# Patient Record
Sex: Male | Born: 1975 | Race: White | Hispanic: No | Smoking: Former smoker
Health system: Southern US, Community
[De-identification: ages and names within clinical notes are randomized; demographics above are authoritative.]

## PROBLEM LIST (undated history)

## (undated) DIAGNOSIS — J302 Other seasonal allergic rhinitis: Secondary | ICD-10-CM

## (undated) HISTORY — DX: Other seasonal allergic rhinitis: J30.2

---

## 2001-03-25 ENCOUNTER — Encounter: Payer: Self-pay | Admitting: Emergency Medicine

## 2001-03-25 ENCOUNTER — Emergency Department (HOSPITAL_COMMUNITY): Admission: EM | Admit: 2001-03-25 | Discharge: 2001-03-25 | Payer: Self-pay | Admitting: Emergency Medicine

## 2001-04-26 ENCOUNTER — Encounter: Payer: Self-pay | Admitting: Emergency Medicine

## 2001-04-26 ENCOUNTER — Emergency Department (HOSPITAL_COMMUNITY): Admission: EM | Admit: 2001-04-26 | Discharge: 2001-04-27 | Payer: Self-pay | Admitting: Emergency Medicine

## 2001-12-02 ENCOUNTER — Encounter: Admission: RE | Admit: 2001-12-02 | Discharge: 2001-12-02 | Payer: Self-pay | Admitting: Family Medicine

## 2001-12-02 ENCOUNTER — Encounter: Payer: Self-pay | Admitting: Family Medicine

## 2005-10-07 ENCOUNTER — Ambulatory Visit: Payer: Self-pay | Admitting: Family Medicine

## 2005-10-13 ENCOUNTER — Ambulatory Visit: Payer: Self-pay | Admitting: Family Medicine

## 2006-02-13 ENCOUNTER — Ambulatory Visit: Payer: Self-pay | Admitting: Internal Medicine

## 2006-02-13 LAB — CONVERTED CEMR LAB
Bilirubin Urine: NEGATIVE
Hemoglobin, Urine: NEGATIVE
Ketones, ur: NEGATIVE mg/dL
Leukocytes, UA: NEGATIVE
Nitrite: NEGATIVE
Protein, ur: NEGATIVE mg/dL
RBC / HPF: NONE SEEN (ref <3)
Specific Gravity, Urine: 1.027 (ref 1.005–1.03)
Urine Glucose: NEGATIVE mg/dL
Urobilinogen, UA: 0.2 (ref 0.0–1.0)
WBC, UA: NONE SEEN cells/hpf (ref <3)
pH: 6 (ref 5.0–8.0)

## 2006-02-14 ENCOUNTER — Encounter: Payer: Self-pay | Admitting: Internal Medicine

## 2006-02-20 ENCOUNTER — Encounter: Admission: RE | Admit: 2006-02-20 | Discharge: 2006-02-20 | Payer: Self-pay | Admitting: Internal Medicine

## 2006-02-25 ENCOUNTER — Ambulatory Visit: Payer: Self-pay | Admitting: Internal Medicine

## 2006-09-23 ENCOUNTER — Ambulatory Visit: Payer: Self-pay | Admitting: Family Medicine

## 2007-05-10 ENCOUNTER — Ambulatory Visit: Payer: Self-pay | Admitting: Family Medicine

## 2009-06-04 ENCOUNTER — Ambulatory Visit: Payer: Self-pay | Admitting: Family Medicine

## 2009-09-24 ENCOUNTER — Ambulatory Visit: Payer: Self-pay | Admitting: Internal Medicine

## 2009-09-24 DIAGNOSIS — R51 Headache: Secondary | ICD-10-CM

## 2009-09-24 DIAGNOSIS — R109 Unspecified abdominal pain: Secondary | ICD-10-CM | POA: Insufficient documentation

## 2009-09-24 DIAGNOSIS — J309 Allergic rhinitis, unspecified: Secondary | ICD-10-CM | POA: Insufficient documentation

## 2009-09-24 DIAGNOSIS — R519 Headache, unspecified: Secondary | ICD-10-CM | POA: Insufficient documentation

## 2009-09-24 LAB — CONVERTED CEMR LAB
Bilirubin Urine: NEGATIVE
Blood in Urine, dipstick: NEGATIVE
Glucose, Urine, Semiquant: NEGATIVE
Ketones, urine, test strip: NEGATIVE
Nitrite: NEGATIVE
Protein, U semiquant: NEGATIVE
Specific Gravity, Urine: 1.02
Urobilinogen, UA: 0.2
WBC Urine, dipstick: NEGATIVE
pH: 5.5

## 2009-09-25 LAB — CONVERTED CEMR LAB
Basophils Absolute: 0 10*3/uL (ref 0.0–0.1)
CO2: 31 meq/L (ref 19–32)
Calcium: 9.4 mg/dL (ref 8.4–10.5)
Chloride: 103 meq/L (ref 96–112)
Eosinophils Absolute: 0.1 10*3/uL (ref 0.0–0.7)
Glucose, Bld: 84 mg/dL (ref 70–99)
HCT: 43.6 % (ref 39.0–52.0)
Hemoglobin: 15.4 g/dL (ref 13.0–17.0)
Lymphocytes Relative: 23.2 % (ref 12.0–46.0)
Lymphs Abs: 1.4 10*3/uL (ref 0.7–4.0)
MCHC: 35.4 g/dL (ref 30.0–36.0)
MCV: 93.5 fL (ref 78.0–100.0)
Monocytes Absolute: 0.4 10*3/uL (ref 0.1–1.0)
Neutro Abs: 4.2 10*3/uL (ref 1.4–7.7)
RDW: 12.9 % (ref 11.5–14.6)
Sodium: 140 meq/L (ref 135–145)

## 2009-09-26 ENCOUNTER — Encounter: Admission: RE | Admit: 2009-09-26 | Discharge: 2009-09-26 | Payer: Self-pay | Admitting: Internal Medicine

## 2009-11-01 ENCOUNTER — Ambulatory Visit: Payer: Self-pay | Admitting: Internal Medicine

## 2009-11-05 LAB — CONVERTED CEMR LAB
AST: 51 units/L — ABNORMAL HIGH (ref 0–37)
Alkaline Phosphatase: 54 units/L (ref 39–117)
LDL Cholesterol: 74 mg/dL (ref 0–99)
Total Bilirubin: 1.1 mg/dL (ref 0.3–1.2)
Total CHOL/HDL Ratio: 3

## 2009-12-06 ENCOUNTER — Telehealth: Payer: Self-pay | Admitting: Internal Medicine

## 2010-03-05 NOTE — Progress Notes (Signed)
Summary: Pt status  ---- Converted from flag ---- ---- 12/03/2009 4:43 PM, Army Fossa CMA wrote: left message for pt to call back.   ---- 12/03/2009 11:26 AM, Elita Quick E. Paz MD wrote: please check on patient, had several GI symptoms @ the time of last OV; if no better will need further evaluation ------------------------------  I spoke with pt and he states that he is doing better, symptoms are getting better. Army Fossa CMA  December 06, 2009 3:56 PM

## 2010-03-05 NOTE — Assessment & Plan Note (Signed)
Summary: bad sinus infection//allergies//lch   Vital Signs:  Patient profile:   35 year old male Height:      75 inches Weight:      208.38 pounds BMI:     26.14 Temp:     98.0 degrees F oral Pulse rate:   80 / minute Pulse rhythm:   regular BP sitting:   122 / 86  (left arm) Cuff size:   regular  Vitals Entered By: Army Fossa CMA (Jun 04, 2009 11:19 AM) CC: Pt here c/o sinus pressure, head congestion, chest congestion onset  Saturday. Has been taking Sudafed., URI symptoms   History of Present Illness:       This is a 35 year old man who presents with URI symptoms.  The symptoms began 3 days ago.  Pt took otc generic sudafed and ziacam with some relief.  The patient complains of nasal congestion, purulent nasal discharge, sore throat, productive cough, and sick contacts, but denies earache.  Associated symptoms include fever.  The patient denies low-grade fever (<100.5 degrees), fever of 100.5-103 degrees, fever of 103.1-104 degrees, fever to >104 degrees, stiff neck, dyspnea, wheezing, rash, vomiting, diarrhea, use of an antipyretic, and response to antipyretic.  The patient also reports headache.  Risk factors for Strep sinusitis include unilateral facial pain.  The patient denies the following risk factors for Strep sinusitis: unilateral nasal discharge, poor response to decongestant, double sickening, tooth pain, Strep exposure, tender adenopathy, and absence of cough.    Allergies (verified): No Known Drug Allergies  Past History:  Past medical, surgical, family and social histories (including risk factors) reviewed for relevance to current acute and chronic problems.  Past Medical History: Reviewed history from 05/10/2007 and no changes required. Unremarkable  Family History: Reviewed history and no changes required.  Social History: Reviewed history from 05/10/2007 and no changes required. Married Former Smoker Alcohol use-no Drug use-no  Review of Systems    See HPI  Physical Exam  General:  Well-developed,well-nourished,in no acute distress; alert,appropriate and cooperative throughout examination Ears:  + fluid no external deformities.   Nose:  no external deformity, R frontal sinus tenderness, and R maxillary sinus tenderness.   Mouth:  Oral mucosa and oropharynx without lesions or exudates.  Teeth in good repair. Neck:  supple and cervical lymphadenopathy.   Lungs:  Normal respiratory effort, chest expands symmetrically. Lungs are clear to auscultation, no crackles or wheezes. Heart:  normal rate and no murmur.   Psych:  Oriented X3 and normally interactive.     Impression & Recommendations:  Problem # 1:  SINUSITIS - ACUTE-NOS (ICD-461.9)  His updated medication list for this problem includes:    Augmentin 875-125 Mg Tabs (Amoxicillin-pot clavulanate) .Marland Kitchen... 1 by mouth two times a day    Nasonex 50 Mcg/act Susp (Mometasone furoate) .Marland Kitchen... 2 sprays each nostril once daily    con't neti pot     con't mucinex Instructed on treatment. Call if symptoms persist or worsen.   Complete Medication List: 1)  Augmentin 875-125 Mg Tabs (Amoxicillin-pot clavulanate) .Marland Kitchen.. 1 by mouth two times a day 2)  Nasonex 50 Mcg/act Susp (Mometasone furoate) .... 2 sprays each nostril once daily Prescriptions: AUGMENTIN 875-125 MG TABS (AMOXICILLIN-POT CLAVULANATE) 1 by mouth two times a day  #20 x 0   Entered and Authorized by:   Loreen Freud DO   Signed by:   Loreen Freud DO on 06/04/2009   Method used:   Electronically to  [facility] [address] [id number]* (retail)       [address]       [place], [place]  [address]       Ph: [phone]       Fax: [phone]   RxID:   [id number]

## 2010-03-05 NOTE — Assessment & Plan Note (Signed)
Summary: CPX AND LABS////SPH   Vital Signs:  Patient profile:   35 year old male Height:      75 inches Weight:      208.50 pounds Pulse rate:   82 / minute Pulse rhythm:   regular BP sitting:   122 / 80  (left arm) Cuff size:   regular  Vitals Entered By: Army Fossa CMA (November 01, 2009 7:55 AM) CC: CPX, fasting Comments [facility] Declines Flu shot   History of Present Illness: CPX ROS Was seen lately with headaches, headaches resolved Complaining of ongoing left lower quadrant discomfort, usually one hour after eating. No change with BMs Also for the last couple of months had constipation and diarrhea from time to time. Some nausea with constipation, no vomiting Denies any weight loss, some weight gain Admits to some heartburn No blood in the stools Denies any difficulty urinating, dysuria or gross hematuria stress level at baseline  Preventive Screening-Counseling & Management  Alcohol-Tobacco     Alcohol drinks/day: 3     Alcohol type: beer  Caffeine-Diet-Exercise     Does Patient Exercise: yes     Times/week: 7  Current Medications (verified): 1)  Claritin-D 24 Hour 10-240 Mg Xr24h-Tab (Loratadine-Pseudoephedrine)  Allergies (verified): 1)  ! Augmentin  Past History:  Past Medical History: Reviewed history from 09/24/2009 and no changes required. Allergic rhinitis  Past Surgical History: Reviewed history from 09/24/2009 and no changes required. no major  Family History: adopted   Social History: Married 1 child  Former Smoker Alcohol use- rarely  Drug use-no diet-- healthy, lots of vegetables  exercise-- Systems analyst, runs, etc Does Patient Exercise:  yes  Physical Exam  General:  alert, well-developed, and well-nourished.   Neck:  no thyromegaly Lungs:  normal respiratory effort, no intercostal retractions, no accessory muscle use, and normal breath sounds.   Heart:  normal rate, regular rhythm, no murmur, and no  gallop.   Abdomen:  soft, no distention, no masses, no guarding, no rigidity, and no rebound tenderness.  mild tenderness to palpation in the left lower quadrant Rectal:  No external abnormalities noted. Normal sphincter tone. No rectal masses or tenderness. Brown stools, Hemoccult negative Prostate:  Prostate gland firm and smooth, no enlargement, nodularity, tenderness, mass, asymmetry or induration. Extremities:  no lower extremity edema Psych:  Cognition and judgment appear intact. Alert and cooperative with normal attention span and concentration.     Impression & Recommendations:  Problem # 1:  ROUTINE GENERAL MEDICAL EXAM@HEALTH  CARE FACL (ICD-V70.0) Td today Declines flu shot Labs Continue with healthy lifestyle  Orders: Venipuncture (04540) TLB-Lipid Panel (80061-LIPID) TLB-Hepatic/Liver Function Pnl (80076-HEPATIC) TLB-B12 + Folate Pnl (98119_14782-N56/OZH) Specimen Handling (08657)  Problem # 2:  ABDOMINAL PAIN (ICD-789.00) several GI symptoms: Heart burn, left lower quadrant discomfort, diarrhea/constipation. recent labs showed no anemia, Hemoccult negative Plan: PPIs Align Requested patient to call me if not better in 4 weeks, he will be referred to GI. Colonoscopy? CT? Patient understood instructions   Complete Medication List: 1)  Claritin-d 24 Hour 10-240 Mg Xr24h-tab (Loratadine-pseudoephedrine) 2)  Prilosec Otc 20 Mg Tbec (Omeprazole magnesium) .... One by mouth every morning before breakfast 3)  Align Caps (Probiotic product) .... One by mouth daily for 6 weeks  Other Orders: Tdap => 33yrs IM (84696) Admin 1st Vaccine (29528)  Patient Instructions: 1)  call in 4 weeks if you continue with constipation-diarrhea-heartburn. I will refer you to a stomach specialist. 2)  Please schedule a follow-up appointment in 1  year.  Prescriptions: ALIGN  CAPS (PROBIOTIC PRODUCT) one by mouth daily for 6 weeks  #30 x 1   Entered and Authorized by:   Elita Quick E. Paz MD    Signed by:   Nolon Rod. Paz MD on 11/01/2009   Method used:   Print then Give to Patient   RxID:   [id number] PRILOSEC OTC 20 MG TBEC (OMEPRAZOLE MAGNESIUM) one by mouth every morning before breakfast  #30 x 1   Entered and Authorized by:   Nolon Rod. Paz MD   Signed by:   Nolon Rod. Paz MD on 11/01/2009   Method used:   Print then Give to Patient   RxID:   [id number]    Risk Factors:  Alcohol use:  yes    Type:  beer    Drinks per day:  3 Exercise:  yes    Times per week:  7    Immunizations Administered:  Tetanus Vaccine:    Vaccine Type: Tdap    Site: right deltoid    Mfr: GlaxoSmithKline    Dose: 0.5 ml    Route: IM    Given by: Army Fossa CMA    Exp. Date: 11/23/2011    Lot #: [id number]

## 2010-03-05 NOTE — Assessment & Plan Note (Signed)
Summary: WOKE UP FRI--WALKED ACROSS FLOOR, "STROBE LIGHT SENSATION", G...   Vital Signs:  Patient profile:   35 year old male Weight:      209.13 pounds Pulse rate:   55 / minute Pulse rhythm:   regular BP sitting:   132 / 84  (left arm) Cuff size:   regular  Vitals Entered By: Army Fossa CMA (September 24, 2009 11:48 AM) CC: Concerned that he may have had seizure?  Comments Woke up Friday am to turn off alarm and he could not move his arm. It looked like there were strobe lights going off, was able to still hear the radio on his alarm clock. All day Friday he was very exhausted and his joints hurt. He has HA Friday that was a lot of pressure. Today he is feeling wel.    History of Present Illness: 3 days ago as his alarm clock went off he reached and tried to cut it off, in need of a noted that his right arm was not responding: Frozen? Shaky? At the same time he saw flashing lights in front of his eyes The sx lasted  50 seconds and was followup by a persistent, and global, moderate headache. The whole day he felt slightly dizzy, very weak, achy all over.  Also had chest pain, located at the left upper chest in a very specific area (he points to the area with his finger) no substernal chest pain The symptoms gradually resolved and today, 3 days after the event, he feels back to normal. Before these events he was not taking any new meds since or over the counter drugs and he was feeling fine.  ROS No fever No runny nose or sore throat no diplopia No vomiting or diarrhea Admits to nausea for about a month associated with the ill-defined left lower quadrant abdominal discomfor, symptoms don't change with p.o. intake  No  GERD symptoms, no blood in the stools  Current Medications (verified): 1)  Claritin-D 24 Hour 10-240 Mg Xr24h-Tab (Loratadine-Pseudoephedrine)  Allergies (verified): No Known Drug Allergies  Past History:  Past Medical History: Allergic rhinitis  Past  Surgical History: no major  Social History: Married 1 child  Former Smoker Alcohol use-no Drug use-no  Physical Exam  General:  alert, well-developed, and well-nourished.   Neck:  full range of motion, nontender to palpation of the cervical spine Lungs:  normal respiratory effort, no intercostal retractions, no accessory muscle use, and normal breath sounds.   Heart:  normal rate, regular rhythm, and no murmur.   Abdomen:  soft, no distention, no masses, no guarding, no rigidity, and no rebound tenderness.  mild tenderness to palpation in the left lower quadrant Extremities:  no edema Neurologic:  alert & oriented X3, cranial nerves II-XII intact, strength normal in all extremities, gait normal, and DTRs symmetrical and normal.   Psych:  Oriented X3, memory intact for recent and remote, normally interactive, good eye contact, not anxious appearing, and not depressed appearing.     Impression & Recommendations:  Problem # 1:  HEADACHE (ICD-784.0) Assessment New presents with symptoms as described above. He had a headache that lasted several hours, prior to that his arm was shaky and he saw some "bright light before his eyes" DDX includes migraine headaches, petit mal?, others plan: Labs MRI of the brain with and without If workup negative, will observe for now unless symptoms recur  Orders: Venipuncture (16109) TLB-BMP (Basic Metabolic Panel-BMET) (80048-METABOL) TLB-CBC Platelet - w/Differential (85025-CBCD) TLB-Sedimentation Rate (ESR) (  04540-JWJ) Radiology Referral (Radiology)  Problem # 2:  abdominal pain reassess and return to the office  Complete Medication List: 1)  Claritin-d 24 Hour 10-240 Mg Xr24h-tab (Loratadine-pseudoephedrine)  Other Orders: UA Dipstick w/o Micro (automated)  (81003)  Patient Instructions: 1)  Please schedule a follow-up appointment in 3 to 4 weeks, fasting, physical exam  Laboratory Results   Urine Tests    Routine Urinalysis     Color: yellow Appearance: Clear Glucose: negative   (Normal Range: Negative) Bilirubin: negative   (Normal Range: Negative) Ketone: negative   (Normal Range: Negative) Spec. Gravity: 1.020   (Normal Range: 1.003-1.035) Blood: negative   (Normal Range: Negative) pH: 5.5   (Normal Range: 5.0-8.0) Protein: negative   (Normal Range: Negative) Urobilinogen: 0.2   (Normal Range: 0-1) Nitrite: negative   (Normal Range: Negative) Leukocyte Esterace: negative   (Normal Range: Negative)    Comments: Army Fossa CMA  September 24, 2009 2:04 PM

## 2010-06-21 NOTE — Letter (Signed)
February 25, 2006     RE:  HIRAN, LEARD  MRN:  [id number]  /  DOB:  Feb 16, 1975   [facility]  [address]  [place], [place] [address]    Dear Colleagues,   This letter is to introduce you to one of my patients, Mr. Jimmy Hardin.  He is a healthy 35 year old gentleman whom I saw early this  month with left groin pain, along with some testicular discomfort.   On my examination he was slightly tender on the left lower quadrant.  The prostate exam was normal.  testicles were normal.  The left  spermatic cord was slightly tender and may be swollen.   My initial impression was that he probably has some mild orchitis.  He  was prescribed Cipro.   An ultrasound done showed normal genitalia.   The urine culture came back and showed Enterococcus, so he was  subsequently changed from Cipro to Macrobid.  I saw him today in  followup.  He still has some discomfort in the testicles, and in fact  now also has some frequency and bladder irritation.   Please evaluate this patient in reference to his genital discomfort with  tenderness over the spermatic cord.  He also has a urinary tract  infection and I wonder if he needs further evaluation.  If  you have any  questions, please do not hesitate to call me.    Sincerely,      Willow Ora, MD  Electronically Signed    JP/MedQ  DD: 02/25/2006  DT: 02/25/2006  Job #: [id number]

## 2012-02-04 HISTORY — PX: ESOPHAGOGASTRODUODENOSCOPY ENDOSCOPY: SHX5814

## 2013-02-09 ENCOUNTER — Telehealth: Payer: Self-pay | Admitting: Internal Medicine

## 2013-02-09 NOTE — Telephone Encounter (Signed)
Patient scheduled appointment for 1.16.15

## 2013-02-09 NOTE — Telephone Encounter (Signed)
Patient would [facility] Is this ok? Thanks!

## 2013-02-09 NOTE — Telephone Encounter (Signed)
Yes please

## 2013-02-09 NOTE — Telephone Encounter (Signed)
OK with me.

## 2013-02-18 ENCOUNTER — Ambulatory Visit: Payer: Self-pay | Admitting: Family

## 2013-02-23 ENCOUNTER — Ambulatory Visit (INDEPENDENT_AMBULATORY_CARE_PROVIDER_SITE_OTHER): Payer: BC Managed Care – PPO | Admitting: Family

## 2013-02-23 ENCOUNTER — Encounter: Payer: Self-pay | Admitting: Family

## 2013-02-23 VITALS — BP 104/70 | HR 52 | Temp 97.9°F | Resp 16 | Ht 75.0 in | Wt 188.0 lb

## 2013-02-23 DIAGNOSIS — M549 Dorsalgia, unspecified: Secondary | ICD-10-CM | POA: Insufficient documentation

## 2013-02-23 DIAGNOSIS — R109 Unspecified abdominal pain: Secondary | ICD-10-CM

## 2013-02-23 DIAGNOSIS — F329 Major depressive disorder, single episode, unspecified: Secondary | ICD-10-CM | POA: Insufficient documentation

## 2013-02-23 DIAGNOSIS — F411 Generalized anxiety disorder: Secondary | ICD-10-CM

## 2013-02-23 DIAGNOSIS — F419 Anxiety disorder, unspecified: Secondary | ICD-10-CM

## 2013-02-23 DIAGNOSIS — R51 Headache: Secondary | ICD-10-CM

## 2013-02-23 LAB — POCT URINALYSIS DIPSTICK
BILIRUBIN UA: NEGATIVE
Glucose, UA: NEGATIVE
Ketones, UA: NEGATIVE
Leukocytes, UA: NEGATIVE
NITRITE UA: NEGATIVE
PH UA: 8
PROTEIN UA: NEGATIVE
RBC UA: NEGATIVE
Spec Grav, UA: <=1.005
Urobilinogen, UA: 0.2

## 2013-02-23 NOTE — Assessment & Plan Note (Signed)
We discussed possibility of adding SSRI.  He will think about it and let me know next visit. In regards to the insomnia, recommended trial of melatonin or 1 tab of benadryl at bedtime. 30 minutes spent with pt today.  >50% of this time was spent counseling patient on anxiety and treatment.

## 2013-02-23 NOTE — Progress Notes (Signed)
Pre visit review using our clinic review tool, if applicable. No additional management support is needed unless otherwise documented below in the visit note. 

## 2013-02-23 NOTE — Addendum Note (Signed)
Addended by: Mervin KungFERGERSON, Andren Bethea A on: 02/23/2013 08:16 AM   Modules accepted: Orders

## 2013-02-23 NOTE — Progress Notes (Signed)
   Subjective:    Patient ID: Jimmy Hardin, male    DOB: [birth year], 38 y.o.   MRN: [id number]  HPI  Jimmy Hardin is a 38 yr old male who presents today to establish care.  He was established with [facility] >3 years ago.  Reports that he develops intermittent left lower back pain.  Notes some darker urine, but no visible blood.   Notes some issues sleeping x 1 year.  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 Finds the financial responsibility somewhat overwhelming.  Reports a few times he has experienced panic attacks.  Reports increased nervousness.  Can't let his mind stop at night.  Having trouble sleeping.     Review of Systems Past Medical History  Diagnosis Date  . Seasonal allergies     History   Social History  . Marital Status: Married    Spouse Name: N/A    Number of Children: N/A  . Years of Education: N/A   Occupational History  . Not on file.   Social History Main Topics  . Smoking status: Former Games developermoker  . Smokeless tobacco: Never Used  . Alcohol Use: Yes     Comment: occasional glass of wine  . Drug Use: Not on file  . Sexual Activity: Not on file   Other Topics Concern  . 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 Esophagogastroduodenoscopy endoscopy  2014    told inflammatory bowel and not to eat gluten    Family History  Problem Relation Age of Onset  . Adopted: Yes    Allergies  Allergen Reactions  . Amoxicillin-Pot Clavulanate     No current outpatient prescriptions on file prior to visit.   No current facility-administered medications on file prior to visit.    BP 104/70  Pulse 52  Temp(Src) 97.9 F (36.6 C) (Oral)  Resp 16  Ht 6\' 3"  (1.905 m)  Wt 188 lb (85.276 kg)  BMI 23.50 kg/m2  SpO2 99%         Objective:   Physical Exam  Constitutional: He is oriented to person, place, and time. He appears well-developed and well-nourished. No distress.  HENT:  Head: Normocephalic and atraumatic.  Cardiovascular: Normal rate and regular rhythm.   No murmur heard. Pulmonary/Chest: Effort normal and breath sounds normal. No respiratory distress. He has no wheezes. He has no rales. He exhibits no tenderness.  Abdominal: Soft. He exhibits no distension and no mass. There is no rebound and no guarding.  Mild suprapubic tenderness  Genitourinary:  Mild left CVA tenderness  Musculoskeletal: He exhibits no edema.  Neurological: He is alert and oriented to person, place, and time.  Skin: Skin is warm and dry.  Psychiatric: He has a normal mood and affect. His behavior is normal. Judgment and thought content normal.          Assessment & Plan:

## 2013-02-23 NOTE — Assessment & Plan Note (Signed)
Urinalysis is negative.  ? Musculoskeletal low back pain.  No current urinary symptoms. Recommended trial of aleve, call if symptoms worsen or if symptoms do not improve.

## 2013-02-23 NOTE — Patient Instructions (Addendum)
You can try benadryl 25mg  at bedtime to help with sleep or a tablet of melatonin. Please schedule a fasting physical at your convenience. You can try aleve 220mg  twice daily for the next few days for your back pain.  [facility]!

## 2013-03-14 ENCOUNTER — Ambulatory Visit (INDEPENDENT_AMBULATORY_CARE_PROVIDER_SITE_OTHER): Payer: BC Managed Care – PPO | Admitting: Family

## 2013-03-14 ENCOUNTER — Encounter: Payer: Self-pay | Admitting: Family

## 2013-03-14 VITALS — BP 106/78 | HR 54 | Temp 97.8°F | Resp 16 | Ht 75.0 in | Wt 193.1 lb

## 2013-03-14 DIAGNOSIS — Z0000 Encounter for general adult medical examination without abnormal findings: Secondary | ICD-10-CM

## 2013-03-14 LAB — HEPATIC FUNCTION PANEL
ALBUMIN: 4.4 g/dL (ref 3.5–5.2)
ALK PHOS: 65 U/L (ref 39–117)
ALT: 17 U/L (ref 0–53)
AST: 34 U/L (ref 0–37)
Bilirubin, Direct: 0.4 mg/dL — ABNORMAL HIGH (ref 0.0–0.3)
Indirect Bilirubin: 1.3 mg/dL — ABNORMAL HIGH (ref 0.2–1.2)
TOTAL PROTEIN: 6.2 g/dL (ref 6.0–8.3)
Total Bilirubin: 1.7 mg/dL — ABNORMAL HIGH (ref 0.2–1.2)

## 2013-03-14 LAB — LIPID PANEL
Cholesterol: 124 mg/dL (ref 0–200)
HDL: 60 mg/dL (ref >39)
LDL Cholesterol: 56 mg/dL (ref 0–99)
Total CHOL/HDL Ratio: 2.1 Ratio
Triglycerides: 38 mg/dL (ref <150)
VLDL: 8 mg/dL (ref 0–40)

## 2013-03-14 LAB — CBC WITH DIFFERENTIAL/PLATELET
Basophils Absolute: 0 10*3/uL (ref 0.0–0.1)
Basophils Relative: 0 % (ref 0–1)
Eosinophils Absolute: 0.2 10*3/uL (ref 0.0–0.7)
Eosinophils Relative: 3 % (ref 0–5)
HEMATOCRIT: 40.9 % (ref 39.0–52.0)
HEMOGLOBIN: 14.4 g/dL (ref 13.0–17.0)
LYMPHS PCT: 24 % (ref 12–46)
Lymphs Abs: 1.4 10*3/uL (ref 0.7–4.0)
MCH: 30.7 pg (ref 26.0–34.0)
MCHC: 35.2 g/dL (ref 30.0–36.0)
MCV: 87.2 fL (ref 78.0–100.0)
MONO ABS: 0.4 10*3/uL (ref 0.1–1.0)
MONOS PCT: 7 % (ref 3–12)
NEUTROS ABS: 3.7 10*3/uL (ref 1.7–7.7)
Neutrophils Relative %: 66 % (ref 43–77)
Platelets: 240 10*3/uL (ref 150–400)
RBC: 4.69 MIL/uL (ref 4.22–5.81)
RDW: 13.5 % (ref 11.5–15.5)
WBC: 5.6 10*3/uL (ref 4.0–10.5)

## 2013-03-14 LAB — BASIC METABOLIC PANEL WITH GFR
BUN: 13 mg/dL (ref 6–23)
CHLORIDE: 102 meq/L (ref 96–112)
CO2: 31 mEq/L (ref 19–32)
CREATININE: 0.76 mg/dL (ref 0.50–1.35)
Calcium: 9.2 mg/dL (ref 8.4–10.5)
GFR, Est African American: >89 mL/min
Glucose, Bld: 76 mg/dL (ref 70–99)
Potassium: 4.2 mEq/L (ref 3.5–5.3)
Sodium: 141 mEq/L (ref 135–145)

## 2013-03-14 LAB — TSH: TSH: 1.994 u[IU]/mL (ref 0.350–4.500)

## 2013-03-14 NOTE — Progress Notes (Signed)
Pre visit review using our clinic review tool, if applicable. No additional management support is needed unless otherwise documented below in the visit note. 

## 2013-03-14 NOTE — Assessment & Plan Note (Signed)
Continue healthy diet, exercise.  Obtain fasting labs including TSH due to fluctuation in weight.   We discussed adding metamucil for constipation and trial of melatonin for sleep. He inquired re: xanax prn sleep and I advised him to try melatonin first.  Hopefully his stress is going to improve over the coming months and he will be able to sleep better.

## 2013-03-14 NOTE — Patient Instructions (Signed)
Please complete lab work prior to leaving. Follow up in 1 year, sooner if problems/concerns.  

## 2013-03-14 NOTE — Progress Notes (Signed)
Subjective:    Patient ID: Jimmy Hardin, male    DOB: [birth year], 38 y.o.   MRN: [id number]  HPI  [name] is a 38 yr old male who presents today for cpx.  Patient presents today for complete physical.  Immunizations: tetanus is up to date.  Flu shot- declines.   Diet:healthy diet.  Exercise: enjoys running/snowboarding. Wt Readings from Last 3 Encounters:  03/14/13 193 lb 1.9 oz (87.599 kg)  02/23/13 188 lb (85.276 kg)  11/01/09 208 lb 8 oz (94.575 kg)   Reports weight fluctuates.  Weight  175 lbs a few months ago.  Reports periods of constipation.  Sometimes skips a few days.  Last visit we discussed anxiety and insomnia  Recommended melatonin or benadryl prn HS.    Low back pain- felt to be musculoskeletal in nature.  Was recommended that he have trial of aleve.  Reports that his back is still mildly sore, but improved. Reports resolution of urinary tract symptoms.   Insomnia- notes some improvement in his insomnia with benadryl, but it is not working as well as it had. Does not wish to try [place].    Review of Systems  See HPI  Past Medical History  Diagnosis Date  . Seasonal allergies     History   Social History  . Marital Status: Married    Spouse Name: N/A    Number of Children: N/A  . Years of Education: N/A   Occupational History  . Not on file.   Social History Main Topics  . Smoking status: Former Games developer  . Smokeless tobacco: Never Used  . Alcohol Use: Yes     Comment: occasional glass of wine  . Drug Use: Not on file  . Sexual Activity: Not on file   Other Topics Concern  . 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 Esophagogastroduodenoscopy endoscopy  2014    told inflammatory bowel and not to eat gluten    Family History  Problem Relation Age of  Onset  . Adopted: Yes    Allergies  Allergen Reactions  . Amoxicillin-Pot Clavulanate     No current outpatient prescriptions on file prior to visit.   No current facility-administered medications on file prior to visit.    BP 106/78  Pulse 54  Temp(Src) 97.8 F (36.6 C) (Oral)  Resp 16  Ht 6\' 3"  (1.905 m)  Wt 193 lb 1.9 oz (87.599 kg)  BMI 24.14 kg/m2  SpO2 99%       Objective:   Physical Exam Physical Exam  Constitutional: He is oriented to person, place, and time. He appears well-developed and well-nourished. No distress.  HENT:  Head: Normocephalic and atraumatic.  Right Ear: Tympanic membrane and ear canal normal.  Left Ear: Tympanic membrane and ear canal normal.  Mouth/Throat: Oropharynx is clear and moist.  Eyes: Pupils are equal, round, and reactive to light. No scleral icterus.  Neck: Normal range of motion. No thyromegaly present.  Cardiovascular: Normal rate and regular rhythm.   No murmur heard. Pulmonary/Chest: Effort normal and breath sounds normal. No respiratory distress. He has no wheezes. He has no rales. He exhibits no tenderness.  Abdominal: Soft. Bowel sounds are normal. He exhibits no distension and no  mass. There is no tenderness. There is no rebound and no guarding.  Musculoskeletal: He exhibits no edema.  Lymphadenopathy:    He has no cervical adenopathy.  Neurological: He is alert and oriented to person, place, and time. He has normal patellar reflexes. He exhibits normal muscle tone. Coordination normal.  Skin: Skin is warm and dry. multiple tattoos noted. Small non-inflammed pimple/cyst noted right posterior neck.   Psychiatric: He has a normal mood and affect. His behavior is normal. Judgment and thought content normal.          Assessment & Plan:          Assessment & Plan:

## 2013-03-16 ENCOUNTER — Telehealth: Payer: Self-pay | Admitting: *Deleted

## 2013-03-16 DIAGNOSIS — R17 Unspecified jaundice: Secondary | ICD-10-CM

## 2013-03-16 NOTE — Telephone Encounter (Signed)
Notified pt and lab order entered.

## 2013-03-16 NOTE — Telephone Encounter (Signed)
Message copied by Kathi SimpersFERGERSON, Herbert Marken A on Wed Mar 16, 2013  4:21 PM ------      Message from: O'SULLIVAN, MELISSA      Created: Tue Mar 15, 2013  1:23 PM       Bilirubin is elevated.  Repeat in 2 weeks (diagnosis elevated bilirubin).  Kidneys, sugar, cholesterol, thyroid, blood count all look good. ------

## 2013-03-18 ENCOUNTER — Ambulatory Visit (INDEPENDENT_AMBULATORY_CARE_PROVIDER_SITE_OTHER): Payer: BC Managed Care – PPO | Admitting: Family Medicine

## 2013-03-18 ENCOUNTER — Encounter: Payer: Self-pay | Admitting: Family Medicine

## 2013-03-18 VITALS — BP 106/78 | HR 56 | Temp 97.8°F | Resp 16 | Ht 75.0 in | Wt 195.0 lb

## 2013-03-18 DIAGNOSIS — R202 Paresthesia of skin: Secondary | ICD-10-CM

## 2013-03-18 DIAGNOSIS — R209 Unspecified disturbances of skin sensation: Secondary | ICD-10-CM

## 2013-03-18 DIAGNOSIS — R55 Syncope and collapse: Secondary | ICD-10-CM

## 2013-03-18 DIAGNOSIS — F418 Other specified anxiety disorders: Secondary | ICD-10-CM

## 2013-03-18 DIAGNOSIS — M542 Cervicalgia: Secondary | ICD-10-CM

## 2013-03-18 DIAGNOSIS — F411 Generalized anxiety disorder: Secondary | ICD-10-CM

## 2013-03-18 DIAGNOSIS — R42 Dizziness and giddiness: Secondary | ICD-10-CM

## 2013-03-18 DIAGNOSIS — F341 Dysthymic disorder: Secondary | ICD-10-CM

## 2013-03-18 DIAGNOSIS — H6690 Otitis media, unspecified, unspecified ear: Secondary | ICD-10-CM

## 2013-03-18 DIAGNOSIS — R110 Nausea: Secondary | ICD-10-CM

## 2013-03-18 DIAGNOSIS — R51 Headache: Secondary | ICD-10-CM

## 2013-03-18 MED ORDER — KETOROLAC TROMETHAMINE 30 MG/ML IJ SOLN: 30.0000 mg | Freq: Once | INTRAMUSCULAR | Status: DC

## 2013-03-18 MED ORDER — ALPRAZOLAM 0.25 MG PO TABS: 0.2500 mg | ORAL_TABLET | Freq: Two times a day (BID) | ORAL | Status: DC | PRN

## 2013-03-18 MED ORDER — ESCITALOPRAM OXALATE 10 MG PO TABS: 10.0000 mg | ORAL_TABLET | Freq: Every day | ORAL | Status: DC

## 2013-03-18 MED ORDER — KETOROLAC TROMETHAMINE 30 MG/ML IJ SOLN
30.0000 mg | Freq: Once | INTRAMUSCULAR | Status: AC
  Administered 2013-03-18: 30 mg via INTRAMUSCULAR

## 2013-03-18 MED ORDER — PROMETHAZINE HCL 25 MG PO TABS: 25.0000 mg | ORAL_TABLET | Freq: Four times a day (QID) | ORAL | Status: DC | PRN

## 2013-03-18 NOTE — Progress Notes (Signed)
Pre visit review using our clinic review tool, if applicable. No additional management support is needed unless otherwise documented below in the visit note/SLS  

## 2013-03-18 NOTE — Patient Instructions (Addendum)
Gatorade, small, frequent meals every 4 hours with proteins   Generalized Anxiety Disorder Generalized anxiety disorder (GAD) is a mental disorder. It interferes with life functions, including relationships, work, and school. GAD is different from normal anxiety, which everyone experiences at some point in their lives in response to specific life events and activities. Normal anxiety actually helps us prepare for and get through these life events and activities. Normal anxiety goes away after the event or activity is over.  GAD causes anxiety that is not necessarily related to specific events or activities. It also causes excess anxiety in proportion to specific events or activities. The anxiety associated with GAD is also difficult to control. GAD can vary from mild to severe. People with severe GAD can have intense waves of anxiety with physical symptoms (panic attacks).  SYMPTOMS The anxiety and worry associated with GAD are difficult to control. This anxiety and worry are related to many life events and activities and also occur more days than not for 6 months or longer. People with GAD also have three or more of the following symptoms (one or more in children):  Restlessness.   Fatigue.  Difficulty concentrating.   Irritability.  Muscle tension.  Difficulty sleeping or unsatisfying sleep. DIAGNOSIS GAD is diagnosed through an assessment by your caregiver. Your caregiver will ask you questions aboutyour mood,physical symptoms, and events in your life. Your caregiver may ask you about your medical history and use of alcohol or drugs, including prescription medications. Your caregiver may also do a physical exam and blood tests. Certain medical conditions and the use of certain substances can cause symptoms similar to those associated with GAD. Your caregiver may refer you to a mental health specialist for further evaluation. TREATMENT The following therapies are usually used to treat  GAD:   Medication Antidepressant medication usually is prescribed for long-term daily control. Antianxiety medications may be added in severe cases, especially when panic attacks occur.   Talk therapy (psychotherapy) Certain types of talk therapy can be helpful in treating GAD by providing support, education, and guidance. A form of talk therapy called cognitive behavioral therapy can teach you healthy ways to think about and react to daily life events and activities.  Stress managementtechniques These include yoga, meditation, and exercise and can be very helpful when they are practiced regularly. A mental health specialist can help determine which treatment is best for you. Some people see improvement with one therapy. However, other people require a combination of therapies. Document Released: 05/17/2012 Document Reviewed: 05/17/2012 [facility] Patient Information 2014 [place], [place].

## 2013-03-20 ENCOUNTER — Encounter: Payer: Self-pay | Admitting: Family Medicine

## 2013-03-20 DIAGNOSIS — H6690 Otitis media, unspecified, unspecified ear: Secondary | ICD-10-CM | POA: Insufficient documentation

## 2013-03-20 MED ORDER — CIPROFLOXACIN HCL 500 MG PO TABS: 500.0000 mg | ORAL_TABLET | Freq: Two times a day (BID) | ORAL | Status: DC

## 2013-03-20 NOTE — Assessment & Plan Note (Signed)
Ciprofloxacin is prescribed. Increase rest and fluids.

## 2013-03-20 NOTE — Progress Notes (Signed)
Duplicate note

## 2013-03-20 NOTE — Assessment & Plan Note (Addendum)
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 They were having a somewhat heated discussion with his ex wife when his symptoms began. He was discussing taxes with her when his symptoms began. He began to feel dizzy and might headache. He says he felt as if he was going to pass out but never did lose consciousness. He felt tingly on all of his peripheries and around his tongue and lips. He had trouble thinking straight and developed headache. He says he noted sensitivity to cold and a strange odor as well. At this point his symptoms have essentially resolved. He did feel sweaty and hot but the symptoms have also resolved. He agrees to start Lexapro 10 mg and given Alprazolam in a small amount to use prn and will reassess at next visit. H e will seek care sooner as needed. Spent greater than 30 minutes with patient 25 minutes was in discussion/counseling

## 2013-03-20 NOTE — Progress Notes (Addendum)
Patient ID: Jimmy Hardin, male   DOB: November 10, 1975, 38 y.o.   MRN: [id number] Jimmy Hardin [id number] November 10, 1975 03/20/2013      Progress Note-Follow Up  Subjective  Chief Complaint  Chief Complaint  Patient presents with  . Dizziness    Pt c/o episode today of dizziness, while conscious, for approx 30 seconds that took 3-4 mintues to recover; impaired cognitive function, sensitivity to cold & odor; neck pain; H/A posterior; tingling in limbs    HPI  Patient is a 38 year old Caucasian male who is in today for evaluation of acute symptoms. This morning and earlier in the week he felt fine. This afternoon while he was speaking [place] he became flushed and lightheaded. He felt dizzy as if he would fall. He felt nauseous and tremulous. He had difficulty concentrating and he felt paresthesias in his arms, legs but and tongue. He developed a headache and neck pain and said he had a funny taste and smell sensation. Symptoms are greatly improved. He did not have any loss of consciousness or hit his head. He did not have any chest pain. 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  Past Medical History  Diagnosis Date  . Seasonal allergies     Past Surgical History  Procedure Laterality Date  . Esophagogastroduodenoscopy endoscopy  2014    told inflammatory bowel and not to eat gluten    Family History  Problem Relation Age of Onset  . Adopted: Yes    History   Social History  . Marital Status: Married    Spouse Name: N/A    Number of Children: N/A  . Years of Education: N/A   Occupational History  . Not on file.   Social History Main Topics  . Smoking status: Former Games developermoker  . Smokeless tobacco: Never Used  . Alcohol Use: Yes     Comment: occasional glass of wine  . Drug Use: Not on file  . Sexual Activity: Not on file   Other Topics Concern  . 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 diphenhydrAMINE (SOMINEX) 25 MG tablet Take 25 mg by mouth at bedtime as needed for allergies or sleep. Benadryl       No current facility-administered medications on file prior to visit.    Allergies  Allergen Reactions  . Amoxicillin-Pot Clavulanate     Review of Systems  Review of Systems  Constitutional: Positive for malaise/fatigue. Negative for fever.  HENT: Negative for congestion.   Eyes: Negative for discharge.  Respiratory: Negative for shortness of breath.   Cardiovascular: Negative for chest pain, palpitations and leg swelling.  Gastrointestinal: Positive for nausea. Negative for abdominal pain and diarrhea.  Genitourinary: Negative for dysuria.  Musculoskeletal: Positive for neck pain. Negative for falls.  Skin: Negative for rash.  Neurological: Positive for tingling and headaches. Negative for loss of consciousness.  Endo/Heme/Allergies: Negative for polydipsia.  Psychiatric/Behavioral: Positive for depression. Negative for suicidal ideas. The patient is nervous/anxious. The patient does not have insomnia.     Objective  BP 106/78  Pulse 56  Temp(Src) 97.8 F (36.6 C) (Oral)  Resp 16  Ht 6\' 3"  (1.905 m)  Wt 195 lb (88.451 kg)  BMI 24.37 kg/m2  SpO2 97%  Physical Exam  Physical Exam  Constitutional: He is oriented to person, place, and time and well-developed, well-nourished, and in no distress. No distress.  HENT:  Head: Normocephalic and atraumatic.  TMs dull and erythematous  Eyes: Conjunctivae are normal.  Neck: Neck supple. No thyromegaly present.  Cardiovascular: Normal rate, regular rhythm and normal heart sounds.   No murmur heard. Pulmonary/Chest: Effort normal and breath sounds normal. No respiratory distress.  Abdominal:  He exhibits no distension and no mass. There is no tenderness.  Musculoskeletal: He exhibits no edema.  Neurological: He is alert and oriented to person, place, and time.  Skin: Skin is warm.  Psychiatric: Memory and judgment normal.  Anxious, jittery    Lab Results  Component Value Date   TSH 1.994 03/14/2013   Lab Results  Component Value Date   WBC 5.6 03/14/2013   HGB 14.4 03/14/2013   HCT 40.9 03/14/2013   MCV 87.2 03/14/2013   PLT 240 03/14/2013   Lab Results  Component Value Date   CREATININE 0.76 03/14/2013   BUN 13 03/14/2013   NA 141 03/14/2013   K 4.2 03/14/2013   CL 102 03/14/2013   CO2 31 03/14/2013   Lab Results  Component Value Date   ALT 17 03/14/2013   AST 34 03/14/2013   ALKPHOS 65 03/14/2013   BILITOT 1.7* 03/14/2013   Lab Results  Component Value Date   CHOL 124 03/14/2013   Lab Results  Component Value Date   HDL 60 03/14/2013   Lab Results  Component Value Date   LDLCALC 56 03/14/2013   Lab Results  Component Value Date   TRIG 38 03/14/2013   Lab Results  Component Value Date   CHOLHDL 2.1 03/14/2013     Assessment & Plan  Anxiety state, unspecified Patient under a great deal of 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 They were having a somewhat heated discussion with his ex wife when his symptoms began. He was discussing taxes with her when his symptoms began. He began to feel dizzy and might headache. He says he felt as if he was going to pass out but never did lose consciousness. He felt tingly on all of his peripheries and around his tongue and lips. He had trouble thinking straight and developed headache. He says he noted sensitivity to cold and a strange odor as well. At this point his symptoms have essentially resolved. He did feel sweaty and hot but the symptoms have also resolved. He agrees to start Lexapro 10 mg and given Alprazolam in a small amount to use prn and will reassess at  next visit. H e will seek care sooner as needed. Spent greater than 30 minutes with patient 25 minutes was in discussion/counseling  HEADACHE Given a shot of Toradol today and allowed some Phenergan to use prn in the future for migrainous symptoms.  Otitis media Ciprofloxacin is prescribed. Increase rest and fluids.

## 2013-03-20 NOTE — Addendum Note (Signed)
Addended by: Danise EdgeBLYTH, Holley Kocurek A on: 03/20/2013 03:08 PM   Modules accepted: Level of Service

## 2013-03-20 NOTE — Assessment & Plan Note (Signed)
Given a shot of Toradol today and allowed some Phenergan to use prn in the future for migrainous symptoms.

## 2013-03-25 ENCOUNTER — Ambulatory Visit (INDEPENDENT_AMBULATORY_CARE_PROVIDER_SITE_OTHER): Payer: BC Managed Care – PPO | Admitting: Family Medicine

## 2013-03-25 ENCOUNTER — Encounter: Payer: Self-pay | Admitting: Family Medicine

## 2013-03-25 VITALS — BP 124/84 | HR 53 | Temp 97.9°F | Resp 18 | Ht 75.0 in | Wt 190.5 lb

## 2013-03-25 DIAGNOSIS — F341 Dysthymic disorder: Secondary | ICD-10-CM

## 2013-03-25 DIAGNOSIS — F418 Other specified anxiety disorders: Secondary | ICD-10-CM

## 2013-03-25 DIAGNOSIS — H6690 Otitis media, unspecified, unspecified ear: Secondary | ICD-10-CM

## 2013-03-25 DIAGNOSIS — F411 Generalized anxiety disorder: Secondary | ICD-10-CM

## 2013-03-25 LAB — HEPATIC FUNCTION PANEL
ALT: 16 U/L (ref 0–53)
AST: 24 U/L (ref 0–37)
Albumin: 5 g/dL (ref 3.5–5.2)
Alkaline Phosphatase: 73 U/L (ref 39–117)
BILIRUBIN DIRECT: 0.3 mg/dL (ref 0.0–0.3)
BILIRUBIN INDIRECT: 1.3 mg/dL — AB (ref 0.2–1.2)
Total Bilirubin: 1.6 mg/dL — ABNORMAL HIGH (ref 0.2–1.2)
Total Protein: 7.2 g/dL (ref 6.0–8.3)

## 2013-03-25 MED ORDER — ALPRAZOLAM 0.25 MG PO TABS: 0.2500 mg | ORAL_TABLET | Freq: Two times a day (BID) | ORAL | Status: DC | PRN

## 2013-03-25 MED ORDER — FLUOXETINE HCL 20 MG PO TABS: 20.0000 mg | ORAL_TABLET | Freq: Every day | ORAL | Status: DC

## 2013-03-25 MED ORDER — ESCITALOPRAM OXALATE 10 MG PO TABS: 5.0000 mg | ORAL_TABLET | Freq: Every day | ORAL | Status: DC

## 2013-03-25 NOTE — Progress Notes (Signed)
Pre visit review using our clinic review tool, if applicable. No additional management support is needed unless otherwise documented below in the visit note/SLS  

## 2013-03-25 NOTE — Patient Instructions (Signed)
Bipolar Disorder °Bipolar disorder is a mental illness. The term bipolar disorder actually is used to describe a group of disorders that all share varying degrees of emotional highs and lows that can interfere with daily functioning, such as work, school, or relationships. Bipolar disorder also can lead to drug abuse, hospitalization, and suicide. °The emotional highs of bipolar disorder are periods of elation or irritability and high energy. These highs can range from a mild form (hypomania) to a severe form (mania). People experiencing episodes of hypomania may appear energetic, excitable, and highly productive. People experiencing mania may behave impulsively or erratically. They often make poor decisions. They may have difficulty sleeping. The most severe episodes of mania can involve having very distorted beliefs or perceptions about the world and seeing or hearing things that are not real (psychotic delusions and hallucinations).  °The emotional lows of bipolar disorder (depression) also can range from mild to severe. Severe episodes of bipolar depression can involve psychotic delusions and hallucinations. °Sometimes people with bipolar disorder experience a state of mixed mood. Symptoms of hypomania or mania and depression are both present during this mixed-mood episode. °SIGNS AND SYMPTOMS °There are signs and symptoms of the episodes of hypomania and mania as well as the episodes of depression. The signs and symptoms of hypomania and mania are similar but vary in severity. They include: °· Inflated self-esteem or feeling of increased self-confidence. °· Decreased need for sleep. °· Unusual talkativeness (rapid or pressured speech) or the feeling of a need to keep talking. °· Sensation of racing thoughts or constant talking, with quick shifts between topics that may or may not be related (flight of ideas). °· Decreased ability to focus or concentrate. °· Increased purposeful activity, such as work, studies,  or social activity, or nonproductive activity, such as pacing, squirming and fidgeting, or finger and toe tapping. °· Impulsive behavior and use of poor judgment, resulting in high-risk activities, such as having unprotected sex or spending excessive amounts of money. °Signs and symptoms of depression include the following:  °· Feelings of sadness, hopelessness, or helplessness. °· Frequent or uncontrollable episodes of crying. °· Lack of feeling anything or caring about anything. °· Difficulty sleeping or sleeping too much.  °· Inability to enjoy the things you used to enjoy.   °· Desire to be alone all the time.   °· Feelings of guilt or worthlessness.  °· Lack of energy or motivation.   °· Difficulty concentrating, remembering, or making decisions.  °· Change in appetite or weight beyond normal fluctuations. °· Thoughts of death or the desire to harm yourself. °DIAGNOSIS  °Bipolar disorder is diagnosed through an assessment by your caregiver. Your caregiver will ask questions about your emotional episodes. There are two main types of bipolar disorder. People with type I bipolar disorder have manic episodes with or without depressive episodes. People with type II bipolar disorder have hypomanic episodes and major depressive episodes, which are more serious than mild depression. The type of bipolar disorder you have can make an important difference in how your illness is monitored and treated. °Your caregiver may ask questions about your medical history and use of alcohol or drugs, including prescription medication. Certain medical conditions and substances also can cause emotional highs and lows that resemble bipolar disorder (secondary bipolar disorder).  °TREATMENT  °Bipolar disorder is a long-term illness. It is best controlled with continuous treatment rather than treatment only when symptoms occur. The following treatments can be prescribed for bipolar disorders: °· Medication Medication can be prescribed by  a doctor   that is an expert in treating mental disorders (psychiatrists). Medications called mood stabilizers are usually prescribed to help control the illness. Other medications are sometimes added if symptoms of mania, depression, or psychotic delusions and hallucinations occur despite the use of a mood stabilizer. °· Talk therapy Some forms of talk therapy are helpful in providing support, education, and guidance. °A combination of medication and talk therapy is best for managing the disorder over time. A procedure in which electricity is applied to your brain through your scalp (electroconvulsive therapy) is used in cases of severe mania when medication and talk therapy do not work or work too slowly. °Document Released: 04/28/2000 Document Revised: 05/17/2012 Document Reviewed: 02/16/2012 °ExitCare® Patient Information ©2014 [employer]. ° °

## 2013-03-27 ENCOUNTER — Encounter: Payer: Self-pay | Admitting: Family Medicine

## 2013-03-27 NOTE — Assessment & Plan Note (Signed)
Tolerating Ciprofloxacin. No ear pain or fevers.

## 2013-03-27 NOTE — Progress Notes (Signed)
Patient ID: Jimmy Hardin, male   DOB: Dec 18, 1975, 38 y.o.   MRN: [id number] Jimmy Collinimothy Purtle [id number] Dec 18, 1975 03/27/2013      Progress Note-Follow Up  Subjective  Chief Complaint  Chief Complaint  Patient presents with  . Follow-up    1-wk. [Anxiety, H/A; Dizziness; Otitis Media]; Pt c/o Lexapro causing restlessness, palpitations & dizziness and reports that ABX exacerbates problems    HPI  Patient is a 38 year old Caucasian male who is in today for followup. At his last visit he was placed on Lexapro for anxiety and unfortunately he felt it made him more restless and cause more palpitations and lightheadedness so he stopped it. It has improved somewhat. He was given ciprofloxacin for an ear infection as well and questions whether this contributed to his palpitations as well. Since stopping the Lexapro his sense of palpitations and tachycardia has improved. He notes he had EEG fatigability while exercising as well. Does note that Xanax helped marginally within he took it. He has noted some more manic moments  the Lexapro. He is given MDQ mood disorder questionnaire today and answers in the affirmative in 7/13 questions in question #1. Question #2 is less clear. He answers yes and no. #3 he describes his situation is a minor problem #4 is about a blood relative and he is adopted.  Past Medical History  Diagnosis Date  . Seasonal allergies     Past Surgical History  Procedure Laterality Date  . Esophagogastroduodenoscopy endoscopy  2014    told inflammatory bowel and not to eat gluten    Family History  Problem Relation Age of Onset  . Adopted: Yes    History   Social History  . Marital Status: Married    Spouse Name: N/A    Number of Children: N/A  . Years of Education: N/A   Occupational History  . Not on file.   Social History Main Topics  . Smoking status: Former Games developermoker  . Smokeless tobacco: Never Used  . Alcohol Use: Yes     Comment: occasional glass of  wine  . Drug Use: Not on file  . Sexual Activity: Not on file   Other Topics Concern  . 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 ciprofloxacin (CIPRO) 500 MG tablet Take 1 tablet (500 mg total) by mouth 2 (two) times daily. For otitis media  20 tablet  0  . promethazine (PHENERGAN) 25 MG tablet Take 1 tablet (25 mg total) by mouth every 6 (six) hours as needed for nausea or vomiting.  30 tablet  0  . diphenhydrAMINE (SOMINEX) 25 MG tablet Take 25 mg by mouth at bedtime as needed for allergies or sleep. Benadryl       No current facility-administered medications on file prior to visit.    Allergies  Allergen Reactions  . Amoxicillin-Pot Clavulanate     Review of Systems  Review of Systems  Constitutional: Negative for fever and malaise/fatigue.  HENT: Negative for congestion.   Eyes: Negative for discharge.  Respiratory: Positive for shortness of breath.   Cardiovascular: Positive for palpitations. Negative for chest pain and leg swelling.  Gastrointestinal: Negative for nausea, abdominal pain and diarrhea.  Genitourinary: Negative for dysuria.  Musculoskeletal: Negative for falls.  Skin: Negative for rash.  Neurological: Negative for loss of consciousness and headaches.  Endo/Heme/Allergies: Negative for polydipsia.  Psychiatric/Behavioral: Positive for depression. Negative for suicidal ideas. The patient is nervous/anxious and has insomnia.     Objective  BP 124/84  Pulse 53  Temp(Src) 97.9 F (36.6 C) (Oral)  Resp 18  Ht 6\' 3"  (1.905 m)  Wt 190 lb 8 oz (86.41 kg)  BMI 23.81 kg/m2  SpO2 98%  Physical Exam  Physical Exam  Constitutional: He is oriented to person, place, and time and well-developed, well-nourished, and in no distress. No  distress.  HENT:  Head: Normocephalic and atraumatic.  Eyes: Conjunctivae are normal.  Neck: Neck supple. No thyromegaly present.  Cardiovascular: Normal rate, regular rhythm and normal heart sounds.   No murmur heard. Pulmonary/Chest: Effort normal and breath sounds normal. No respiratory distress.  Abdominal: He exhibits no distension and no mass. There is no tenderness.  Musculoskeletal: He exhibits no edema.  Neurological: He is alert and oriented to person, place, and time.  Skin: Skin is warm.  Psychiatric: Memory, affect and judgment normal.    Lab Results  Component Value Date   TSH 1.994 03/14/2013   Lab Results  Component Value Date   WBC 5.6 03/14/2013   HGB 14.4 03/14/2013   HCT 40.9 03/14/2013   MCV 87.2 03/14/2013   PLT 240 03/14/2013   Lab Results  Component Value Date   CREATININE 0.76 03/14/2013   BUN 13 03/14/2013   NA 141 03/14/2013   K 4.2 03/14/2013   CL 102 03/14/2013   CO2 31 03/14/2013   Lab Results  Component Value Date   ALT 16 03/25/2013   AST 24 03/25/2013   ALKPHOS 73 03/25/2013   BILITOT 1.6* 03/25/2013   Lab Results  Component Value Date   CHOL 124 03/14/2013   Lab Results  Component Value Date   HDL 60 03/14/2013   Lab Results  Component Value Date   LDLCALC 56 03/14/2013   Lab Results  Component Value Date   TRIG 38 03/14/2013   Lab Results  Component Value Date   CHOLHDL 2.1 03/14/2013     Assessment & Plan  Anxiety state, unspecified Did not tolerate 10 mg of Lexapro, will try switching to Prozac. May use Alprazolam bid for the next 2 weeks then cut down as possible after that.   Otitis media Tolerating Ciprofloxacin. No ear pain or fevers.

## 2013-03-27 NOTE — Assessment & Plan Note (Addendum)
Did not tolerate 10 mg of Lexapro, will try switching to Prozac. May use Alprazolam bid for the next 2 weeks then cut down as possible after that.

## 2013-04-22 ENCOUNTER — Encounter: Payer: Self-pay | Admitting: Family

## 2013-04-22 ENCOUNTER — Ambulatory Visit (INDEPENDENT_AMBULATORY_CARE_PROVIDER_SITE_OTHER): Payer: BC Managed Care – PPO | Admitting: Family

## 2013-04-22 VITALS — BP 120/78 | HR 51 | Temp 97.6°F | Resp 16 | Ht 75.0 in | Wt 196.1 lb

## 2013-04-22 DIAGNOSIS — F418 Other specified anxiety disorders: Secondary | ICD-10-CM

## 2013-04-22 DIAGNOSIS — F341 Dysthymic disorder: Secondary | ICD-10-CM

## 2013-04-22 DIAGNOSIS — F411 Generalized anxiety disorder: Secondary | ICD-10-CM

## 2013-04-22 MED ORDER — ALPRAZOLAM 0.25 MG PO TABS: 0.2500 mg | ORAL_TABLET | Freq: Two times a day (BID) | ORAL | Status: DC | PRN

## 2013-04-22 NOTE — Progress Notes (Signed)
   Subjective:    Patient ID: Jimmy Hardin, male    DOB: 09-Jul-1975, 38 y.o.   MRN: [id number]  HPI  Jimmy Hardin is a 38 yr old male who presents today for follow up of anxiety. He was started on lexapro initially by Dr. Abner GreenspanBlyth, but unfortunately it made him feel more restless with palpitations/lightheadedness so he discontinued. He was seen back in the office on 2/20 by Dr. Abner GreenspanBlyth and was given rx for prozac as well as prn alprazolam. He reports that he has continued lexapro instead of starting prozac.  Still has some associated nausea and light headedness. Reports that overall, his symptoms are improved. He is no longer feeling overwhelmed as he had. Has trouble sleeping if he does not take an HS xanax.     Review of Systems    see HPI  Past Medical History  Diagnosis Date  . Seasonal allergies     History   Social History  . Marital Status: Married    Spouse Name: N/A    Number of Children: N/A  . Years of Education: N/A   Occupational History  . Not on file.   Social History Main Topics  . Smoking status: Former Games developermoker  . Smokeless tobacco: Never Used  . Alcohol Use: Yes     Comment: occasional glass of wine  . Drug Use: Not on file  . Sexual Activity: Not on file   Other Topics Concern  . 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 Esophagogastroduodenoscopy endoscopy  2014    told inflammatory bowel and not to eat gluten    Family History  Problem Relation Age of Onset  . Adopted: Yes    Allergies  Allergen Reactions  . Amoxicillin-Pot Clavulanate     Current Outpatient Prescriptions on File Prior to Visit  Medication Sig Dispense Refill  . FLUoxetine (PROZAC) 20 MG tablet Take 1 tablet (20 mg total) by mouth daily.  30 tablet  2   No current facility-administered  medications on file prior to visit.    BP 120/78  Pulse 51  Temp(Src) 97.6 F (36.4 C) (Oral)  Resp 16  Ht 6\' 3"  (1.905 m)  Wt 196 lb 1.3 oz (88.941 kg)  BMI 24.51 kg/m2  SpO2 98%    Objective:   Physical Exam  Constitutional: He is oriented to person, place, and time. He appears well-developed and well-nourished. No distress.  HENT:  Head: Normocephalic and atraumatic.  Right Ear: Tympanic membrane and ear canal normal.  Left Ear: Tympanic membrane and ear canal normal.  Cardiovascular: Normal rate and regular rhythm.   No murmur heard. Pulmonary/Chest: Effort normal and breath sounds normal. No respiratory distress. He has no wheezes. He has no rales. He exhibits no tenderness.  Neurological: He is alert and oriented to person, place, and time.  Skin: Skin is warm and dry.  Psychiatric: He has a normal mood and affect. His behavior is normal. Judgment and thought content normal.          Assessment & Plan:

## 2013-04-22 NOTE — Assessment & Plan Note (Signed)
Improving, but still having side effects from lexapro. Wants to try prozac. I have advised him to stop lexapro and start prozac 1/2 tab daily for 1 week.  Increase to a full tablet once daily on week two.  Follow up in 6 weeks, call sooner if he continues to have side effects on the prozac.  Refill of xanax was provided today.

## 2013-04-22 NOTE — Progress Notes (Signed)
Pre visit review using our clinic review tool, if applicable. No additional management support is needed unless otherwise documented below in the visit note. 

## 2013-04-22 NOTE — Patient Instructions (Signed)
Stop lexapro, start prozac. Add claritin 10mg  once daily for allergy symptoms. Follow up in 6 weeks.

## 2013-04-29 ENCOUNTER — Telehealth: Payer: Self-pay | Admitting: *Deleted

## 2013-04-29 MED ORDER — ESCITALOPRAM OXALATE 10 MG PO TABS: 10.0000 mg | ORAL_TABLET | Freq: Every day | ORAL | Status: DC

## 2013-04-29 NOTE — Telephone Encounter (Signed)
Notified pt and he voices understanding. 

## 2013-04-29 NOTE — Telephone Encounter (Signed)
I recommend that he return to lexapro.  Call if depression symptoms worsen, or if they do not improve.  Jittery feeling rapid heart rate that he experience on lexapro, is likely related to anxiety and should improve in time with the lexapro.  Follow up on 5/4, sooner if problems/concerns. Lexapro refill sent to pharmacy.

## 2013-04-29 NOTE — Telephone Encounter (Signed)
Spoke with pt. He states he is taking 1/2 tablet of Prozac and has begun to feel very depressed and quick to anger over "little things". States he did not take Prozac last night. Feels depressed and spacey today.  He is requesting to go back on lexapro but notes that it caused his HR to elevate and he felt jittery while taking it.  Please advise.

## 2013-06-06 ENCOUNTER — Encounter: Payer: Self-pay | Admitting: Family

## 2013-06-06 ENCOUNTER — Ambulatory Visit (INDEPENDENT_AMBULATORY_CARE_PROVIDER_SITE_OTHER): Payer: BC Managed Care – PPO | Admitting: Family

## 2013-06-06 VITALS — BP 112/70 | HR 46 | Temp 97.6°F | Resp 16 | Ht 75.0 in | Wt 196.1 lb

## 2013-06-06 DIAGNOSIS — R2231 Localized swelling, mass and lump, right upper limb: Secondary | ICD-10-CM

## 2013-06-06 DIAGNOSIS — L218 Other seborrheic dermatitis: Secondary | ICD-10-CM

## 2013-06-06 DIAGNOSIS — L219 Seborrheic dermatitis, unspecified: Secondary | ICD-10-CM

## 2013-06-06 DIAGNOSIS — F411 Generalized anxiety disorder: Secondary | ICD-10-CM

## 2013-06-06 DIAGNOSIS — R229 Localized swelling, mass and lump, unspecified: Secondary | ICD-10-CM
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
## 2013-06-06 NOTE — Progress Notes (Signed)
Pre visit review using our clinic review tool, if applicable. No additional management support is needed unless otherwise documented below in the visit note. 

## 2013-06-06 NOTE — Patient Instructions (Signed)
You will be contacted about your referral to the hand specialist.  Please follow up in 6 months, sooner if problems/concerns.

## 2013-06-07 DIAGNOSIS — L219 Seborrheic dermatitis, unspecified: Secondary | ICD-10-CM | POA: Insufficient documentation

## 2013-06-07 DIAGNOSIS — R2231 Localized swelling, mass and lump, right upper limb: Secondary | ICD-10-CM | POA: Insufficient documentation

## 2013-06-07 NOTE — Assessment & Plan Note (Signed)
Will refer to hand surgeon for further evaluation.  

## 2013-06-07 NOTE — Assessment & Plan Note (Signed)
Improved on low dose lexapro and prn alprazolam. Continue same.

## 2013-06-07 NOTE — Assessment & Plan Note (Signed)
Recommended good moisturizer and prn use of hydrocortisone cream.

## 2013-06-20 ENCOUNTER — Other Ambulatory Visit: Payer: Self-pay | Admitting: Family Medicine

## 2013-06-21 NOTE — Telephone Encounter (Signed)
Please advise refill?  Last OV was 06-06-13  Last RX was done on 04-22-13 quantity 60 with 0 refills

## 2013-06-21 NOTE — Telephone Encounter (Signed)
OK to send 60 tabs, zero refills.  

## 2013-07-20 ENCOUNTER — Other Ambulatory Visit: Payer: Self-pay | Admitting: Family

## 2013-07-20 NOTE — Telephone Encounter (Signed)
[facility] Alprazolam 0.25 mg BID PRN Last filled - 05.19.15, #60x0 Last AEX - 05.04.15 Next AEX - 6 Months  Hold refill until Friday as pt should not be due until 07/22/13.

## 2013-07-22 NOTE — Telephone Encounter (Signed)
Please Advise on refills/SLS  

## 2013-07-25 ENCOUNTER — Other Ambulatory Visit: Payer: Self-pay | Admitting: Family

## 2013-07-25 NOTE — Telephone Encounter (Signed)
Rx request phoned to pharmacy/SLS  

## 2013-07-25 NOTE — Telephone Encounter (Signed)
Ok to send refill  

## 2013-07-25 NOTE — Telephone Encounter (Signed)
OK to send #60 tabs zero refills.  

## 2013-08-04 NOTE — Telephone Encounter (Signed)
Rx request phoned to pharmacy/SLS  

## 2013-08-18 ENCOUNTER — Ambulatory Visit (INDEPENDENT_AMBULATORY_CARE_PROVIDER_SITE_OTHER): Payer: BC Managed Care – PPO | Admitting: Physician Assistant

## 2013-08-18 ENCOUNTER — Encounter: Payer: Self-pay | Admitting: Physician Assistant

## 2013-08-18 VITALS — BP 108/78 | HR 59 | Temp 97.9°F | Resp 16 | Ht 75.0 in | Wt 199.0 lb

## 2013-08-18 DIAGNOSIS — R21 Rash and other nonspecific skin eruption: Secondary | ICD-10-CM | POA: Insufficient documentation

## 2013-08-18 LAB — CBC
HCT: 42.9 % (ref 39.0–52.0)
HEMOGLOBIN: 15.6 g/dL (ref 13.0–17.0)
MCH: 31.3 pg (ref 26.0–34.0)
MCHC: 36.4 g/dL — ABNORMAL HIGH (ref 30.0–36.0)
MCV: 86 fL (ref 78.0–100.0)
Platelets: 241 10*3/uL (ref 150–400)
RBC: 4.99 MIL/uL (ref 4.22–5.81)
RDW: 13.1 % (ref 11.5–15.5)
WBC: 5.8 10*3/uL (ref 4.0–10.5)

## 2013-08-18 LAB — SEDIMENTATION RATE: SED RATE: 1 mm/h (ref 0–16)

## 2013-08-18 LAB — HIV ANTIBODY (ROUTINE TESTING W REFLEX): HIV 1&2 Ab, 4th Generation: NONREACTIVE

## 2013-08-18 LAB — RPR

## 2013-08-18 MED ORDER — PREDNISONE 10 MG PO TABS: ORAL_TABLET | ORAL | Status: DC

## 2013-08-18 MED ORDER — METHYLPREDNISOLONE ACETATE 40 MG/ML IJ SUSP
40.0000 mg | Freq: Once | INTRAMUSCULAR | Status: AC
Start: 2013-08-18 — End: 2013-08-18
  Administered 2013-08-18: 40 mg via INTRAMUSCULAR

## 2013-08-18 NOTE — Patient Instructions (Signed)
Please obtain labs.  I will call you with your results.  Start oral steroids tomorrow morning.  Continue claritin or benadryl  Avoid heat as it will worsen appearance and itching/burning of rash.  Avoid harsh clothing material.  We will treat you for any lab abnormalities that come back.  If labs unremarkable and symptoms persist, despite treatment, we will need to set you up with a dermatologist.

## 2013-08-18 NOTE — Progress Notes (Signed)
Pre visit review using our clinic review tool, if applicable. No additional management support is needed unless otherwise documented below in the visit note/SLS  

## 2013-08-18 NOTE — Assessment & Plan Note (Signed)
Extensive rash.  Unclear etiology.  Possible allergic dermatitis, but cannot exclude underlying pathology. Will obtain lab workup to assess ESR, ANA, CBC, Lyme, RMSF, RPR and HIV antibody. IM Depo given by nursing.  Rx Prednisone taper.  Encourage soft clothing.  Avoid sun exposure.  Apply cool compresses.  Daily claritin or Benadryl.  If workup unremarkable and symptoms do not respond quickly to steroid, will need to send patient to Dermatology.

## 2013-08-18 NOTE — Progress Notes (Signed)
Patient presents to clinic today c/o pruritic and burning rash of entire body first noted over the past week.  Rash started on the patient's extremities and has spread since.  Patient states he has had a similar rash in the past but the doctor could not figure out the etiology of the rash.  Patient denies change to laundry detergent, lotions or other hygiene products.  Denies new pet.  Denies exposure to rhus species of plant.  Denies recent ravel or sick contact.  Does endorse tick bite around 1 month ago.  Denies fever, chills, fatigue.  Denies arthralgias.  Denies recent sexual activity.  Is monogamous with his wife.  Past Medical History  Diagnosis Date  . Seasonal allergies     Current Outpatient Prescriptions on File Prior to Visit  Medication Sig Dispense Refill  . ALPRAZolam (XANAX) 0.25 MG tablet TAKE 1 TABLET BY MOUTH TWICE DAILY AS NEEDED FOR ANXIETY OR SLEEP  60 tablet  0  . escitalopram (LEXAPRO) 10 MG tablet Take 5 mg by mouth daily.       No current facility-administered medications on file prior to visit.    Allergies  Allergen Reactions  . Amoxicillin-Pot Clavulanate   . Prozac [Fluoxetine Hcl] Other (See Comments)    Mood swing, angry, increased depression    Family History  Problem Relation Age of Onset  . Adopted: Yes    History   Social History  . Marital Status: Married    Spouse Name: N/A    Number of Children: N/A  . Years of Education: N/A   Social History Main Topics  . Smoking status: Former Research scientist (life sciences)  . Smokeless tobacco: Never Used  . Alcohol Use: Yes     Comment: occasional glass of wine  . Drug Use: None  . Sexual Activity: None   Other Topics Concern  . 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  All other ROS are negative.  BP 108/78  Pulse 59  Temp(Src) 97.9 F (36.6 C) (Oral)  Resp 16   Ht 6' 3"  (1.905 m)  Wt 199 lb (90.266 kg)  BMI 24.87 kg/m2  SpO2 98%  Physical Exam  Vitals reviewed. Constitutional: He is oriented to person, place, and time and well-developed, well-nourished, and in no distress.  HENT:  Head: Normocephalic and atraumatic.  Eyes: Conjunctivae are normal. Pupils are equal, round, and reactive to light.  Neck: Neck supple.  Cardiovascular: Normal rate, regular rhythm, normal heart sounds and intact distal pulses.   Pulmonary/Chest: Effort normal and breath sounds normal. No respiratory distress. He has no wheezes. He has no rales. He exhibits no tenderness.  Neurological: He is alert and oriented to person, place, and time.  Skin: Skin is warm and dry.  Presence of an erythematous maculopapular rash noted scattered over neck, torso, back, groin and bilateral upper and lower extremities.  No rash noted on palms or soles. Rash is worse where scratching has occurred, and takes on a linear appearance.  Negative dermatographism.    Assessment/Plan: Rash and nonspecific skin eruption Extensive rash.  Unclear etiology.  Possible allergic dermatitis, but cannot exclude underlying pathology. Will obtain lab workup to assess ESR, ANA, CBC, Lyme, RMSF, RPR and HIV antibody. IM Depo given by nursing.  Rx Prednisone taper.  Encourage soft clothing.  Avoid sun exposure.  Apply cool compresses.  Daily claritin or Benadryl.  If workup unremarkable and symptoms do not respond quickly to steroid, will need to send patient to Dermatology.

## 2013-08-19 LAB — ROCKY MTN SPOTTED FVR ABS PNL(IGG+IGM)
RMSF IgG: 0.16 IV
RMSF IgM: 0.36 IV

## 2013-08-19 LAB — B. BURGDORFI ANTIBODIES: B burgdorferi Ab IgG+IgM: 0.44 {ISR}

## 2013-08-19 LAB — ANA: Anti Nuclear Antibody(ANA): NEGATIVE

## 2013-08-23 ENCOUNTER — Telehealth: Payer: Self-pay

## 2013-08-23 NOTE — Telephone Encounter (Signed)
Pt was contacted and VM was left stating Labs were well.Pt was advised to contact office if there were any questions.

## 2013-10-03 ENCOUNTER — Other Ambulatory Visit: Payer: Self-pay | Admitting: Family

## 2013-10-04 ENCOUNTER — Encounter: Payer: Self-pay | Admitting: Family

## 2013-10-04 ENCOUNTER — Ambulatory Visit (INDEPENDENT_AMBULATORY_CARE_PROVIDER_SITE_OTHER): Payer: BC Managed Care – PPO | Admitting: Family

## 2013-10-04 ENCOUNTER — Ambulatory Visit (HOSPITAL_BASED_OUTPATIENT_CLINIC_OR_DEPARTMENT_OTHER)
Admission: RE | Admit: 2013-10-04 | Discharge: 2013-10-04 | Disposition: A | Discharge status: Home / Self Care | Payer: BC Managed Care – PPO | Source: Ambulatory Visit | Attending: Family | Admitting: Family

## 2013-10-04 VITALS — BP 100/80 | HR 51 | Temp 98.0°F | Resp 16 | Ht 75.0 in | Wt 202.2 lb

## 2013-10-04 DIAGNOSIS — S0990XA Unspecified injury of head, initial encounter: Secondary | ICD-10-CM | POA: Diagnosis present

## 2013-10-04 DIAGNOSIS — X58XXXA Exposure to other specified factors, initial encounter: Secondary | ICD-10-CM | POA: Insufficient documentation

## 2013-10-04 DIAGNOSIS — S0190XA Unspecified open wound of unspecified part of head, initial encounter: Secondary | ICD-10-CM | POA: Insufficient documentation

## 2013-10-04 DIAGNOSIS — S060X0A Concussion without loss of consciousness, initial encounter: Secondary | ICD-10-CM

## 2013-10-04 MED ORDER — ESCITALOPRAM OXALATE 10 MG PO TABS: 5.0000 mg | ORAL_TABLET | Freq: Every day | ORAL | Status: DC

## 2013-10-04 NOTE — Patient Instructions (Signed)
Please complete your CT head on the first floor. You may continue tylenol as needed for pain. Call if increased headache, nausea/vomitting or if symptoms do not continue to improve in then next 1 week.  Follow up in 6 months.   Concussion A concussion, or closed-head injury, is a brain injury caused by a direct blow to the head or by a quick and sudden movement (jolt) of the head or neck. Concussions are usually not life-threatening. Even so, the effects of a concussion can be serious. If you have had a concussion before, you are more likely to experience concussion-like symptoms after a direct blow to the head.  CAUSES  Direct blow to the head, such as from running into another player during a soccer game, being hit in a fight, or hitting your head on a hard surface.  A jolt of the head or neck that causes the brain to move back and forth inside the skull, such as in a car crash. SIGNS AND SYMPTOMS The signs of a concussion can be hard to notice. Early on, they may be missed by you, family members, and health care providers. You may look fine but act or feel differently. Symptoms are usually temporary, but they may last for days, weeks, or even longer. Some symptoms may appear right away while others may not show up for hours or days. Every head injury is different. Symptoms include:  Mild to moderate headaches that will not go away.  A feeling of pressure inside your head.  Having more trouble than usual:  Learning or remembering things you have heard.  Answering questions.  Paying attention or concentrating.  Organizing daily tasks.  Making decisions and solving problems.  Slowness in thinking, acting or reacting, speaking, or reading.  Getting lost or being easily confused.  Feeling tired all the time or lacking energy (fatigued).  Feeling drowsy.  Sleep disturbances.  Sleeping more than usual.  Sleeping less than usual.  Trouble falling asleep.  Trouble sleeping  (insomnia).  Loss of balance or feeling lightheaded or dizzy.  Nausea or vomiting.  Numbness or tingling.  Increased sensitivity to:  Sounds.  Lights.  Distractions.  Vision problems or eyes that tire easily.  Diminished sense of taste or smell.  Ringing in the ears.  Mood changes such as feeling sad or anxious.  Becoming easily irritated or angry for little or no reason.  Lack of motivation.  Seeing or hearing things other people do not see or hear (hallucinations). DIAGNOSIS Your health care provider can usually diagnose a concussion based on a description of your injury and symptoms. He or she will ask whether you passed out (lost consciousness) and whether you are having trouble remembering events that happened right before and during your injury. Your evaluation might include:  A brain scan to look for signs of injury to the brain. Even if the test shows no injury, you may still have a concussion.  Blood tests to be sure other problems are not present. TREATMENT  Concussions are usually treated in an emergency department, in urgent care, or at a clinic. You may need to stay in the hospital overnight for further treatment.  Tell your health care provider if you are taking any medicines, including prescription medicines, over-the-counter medicines, and natural remedies. Some medicines, such as blood thinners (anticoagulants) and aspirin, may increase the chance of complications. Also tell your health care provider whether you have had alcohol or are taking illegal drugs. This information may affect treatment.  Your health care provider will send you home with important instructions to follow.  How fast you will recover from a concussion depends on many factors. These factors include how severe your concussion is, what part of your brain was injured, your age, and how healthy you were before the concussion.  Most people with mild injuries recover fully. Recovery can  take time. In general, recovery is slower in older persons. Also, persons who have had a concussion in the past or have other medical problems may find that it takes longer to recover from their current injury. HOME CARE INSTRUCTIONS General Instructions  Carefully follow the directions your health care provider gave you.  Only take over-the-counter or prescription medicines for pain, discomfort, or fever as directed by your health care provider.  Take only those medicines that your health care provider has approved.  Do not drink alcohol until your health care provider says you are well enough to do so. Alcohol and certain other drugs may slow your recovery and can put you at risk of further injury.  If it is harder than usual to remember things, write them down.  If you are easily distracted, try to do one thing at a time. For example, do not try to watch TV while fixing dinner.  Talk with family members or close friends when making important decisions.  Keep all follow-up appointments. Repeated evaluation of your symptoms is recommended for your recovery.  Watch your symptoms and tell others to do the same. Complications sometimes occur after a concussion. Older adults with a brain injury may have a higher risk of serious complications, such as a blood clot on the brain.  Tell your teachers, school nurse, school counselor, coach, athletic trainer, or work Production designer, theatre/television/film about your injury, symptoms, and restrictions. Tell them about what you can or cannot do. They should watch for:  Increased problems with attention or concentration.  Increased difficulty remembering or learning new information.  Increased time needed to complete tasks or assignments.  Increased irritability or decreased ability to cope with stress.  Increased symptoms.  Rest. Rest helps the brain to heal. Make sure you:  Get plenty of sleep at night. Avoid staying up late at night.  Keep the same bedtime hours on  weekends and weekdays.  Rest during the day. Take daytime naps or rest breaks when you feel tired.  Limit activities that require a lot of thought or concentration. These include:  Doing homework or job-related work.  Watching TV.  Working on the computer.  Avoid any situation where there is potential for another head injury (football, hockey, soccer, basketball, martial arts, downhill snow sports and horseback riding). Your condition will get worse every time you experience a concussion. You should avoid these activities until you are evaluated by the appropriate follow-up health care providers. Returning To Your Regular Activities You will need to return to your normal activities slowly, not all at once. You must give your body and brain enough time for recovery.  Do not return to sports or other athletic activities until your health care provider tells you it is safe to do so.  Ask your health care provider when you can drive, ride a bicycle, or operate heavy machinery. Your ability to react may be slower after a brain injury. Never do these activities if you are dizzy.  Ask your health care provider about when you can return to work or school. Preventing Another Concussion It is very important to avoid another brain injury,  especially before you have recovered. In rare cases, another injury can lead to permanent brain damage, brain swelling, or death. The risk of this is greatest during the first 7-10 days after a head injury. Avoid injuries by:  Wearing a seat belt when riding in a car.  Drinking alcohol only in moderation.  Wearing a helmet when biking, skiing, skateboarding, skating, or doing similar activities.  Avoiding activities that could lead to a second concussion, such as contact or recreational sports, until your health care provider says it is okay.  Taking safety measures in your home.  Remove clutter and tripping hazards from floors and stairways.  Use grab bars  in bathrooms and handrails by stairs.  Place non-slip mats on floors and in bathtubs.  Improve lighting in dim areas. SEEK MEDICAL CARE IF:  You have increased problems paying attention or concentrating.  You have increased difficulty remembering or learning new information.  You need more time to complete tasks or assignments than before.  You have increased irritability or decreased ability to cope with stress.  You have more symptoms than before. Seek medical care if you have any of the following symptoms for more than 2 weeks after your injury:  Lasting (chronic) headaches.  Dizziness or balance problems.  Nausea.  Vision problems.  Increased sensitivity to noise or light.  Depression or mood swings.  Anxiety or irritability.  Memory problems.  Difficulty concentrating or paying attention.  Sleep problems.  Feeling tired all the time. SEEK IMMEDIATE MEDICAL CARE IF:  You have severe or worsening headaches. These may be a sign of a blood clot in the brain.  You have weakness (even if only in one hand, leg, or part of the face).  You have numbness.  You have decreased coordination.  You vomit repeatedly.  You have increased sleepiness.  One pupil is larger than the other.  You have convulsions.  You have slurred speech.  You have increased confusion. This may be a sign of a blood clot in the brain.  You have increased restlessness, agitation, or irritability.  You are unable to recognize people or places.  You have neck pain.  It is difficult to wake you up.  You have unusual behavior changes.  You lose consciousness. MAKE SURE YOU:  Understand these instructions.  Will watch your condition.  Will get help right away if you are not doing well or get worse. Document Released: 04/12/2003 Document Revised: 01/25/2013 Document Reviewed: 08/12/2012 [facility] Patient Information 2015 [place], [place]. This information is not intended to  replace advice given to you by your health care provider. Make sure you discuss any questions you have with your health care provider.
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
## 2013-10-04 NOTE — Telephone Encounter (Signed)
Lexapro Rx sent to [facility] pharmacy per pt request today.

## 2013-10-04 NOTE — Assessment & Plan Note (Signed)
Will refer for CT head.  Continue tylenol as needed for headache.

## 2013-10-04 NOTE — Progress Notes (Signed)
Pre visit review using our clinic review tool, if applicable. No additional management support is needed unless otherwise documented below in the visit note. 

## 2013-10-07 ENCOUNTER — Ambulatory Visit (INDEPENDENT_AMBULATORY_CARE_PROVIDER_SITE_OTHER): Payer: BC Managed Care – PPO | Admitting: Physician Assistant

## 2013-10-07 ENCOUNTER — Encounter: Payer: Self-pay | Admitting: Physician Assistant

## 2013-10-07 ENCOUNTER — Other Ambulatory Visit: Payer: Self-pay | Admitting: Family

## 2013-10-07 VITALS — BP 125/70 | HR 80 | Temp 98.2°F | Wt 202.0 lb

## 2013-10-07 DIAGNOSIS — R062 Wheezing: Secondary | ICD-10-CM

## 2013-10-07 DIAGNOSIS — B9689 Other specified bacterial agents as the cause of diseases classified elsewhere: Secondary | ICD-10-CM | POA: Insufficient documentation

## 2013-10-07 DIAGNOSIS — J019 Acute sinusitis, unspecified: Secondary | ICD-10-CM

## 2013-10-07 LAB — POCT RAPID STREP A (OFFICE): RAPID STREP A SCREEN: NEGATIVE

## 2013-10-07 MED ORDER — SULFAMETHOXAZOLE-TRIMETHOPRIM 800-160 MG PO TABS: 1.0000 | ORAL_TABLET | Freq: Two times a day (BID) | ORAL | Status: DC

## 2013-10-07 MED ORDER — ALBUTEROL SULFATE HFA 108 (90 BASE) MCG/ACT IN AERS: 2.0000 | INHALATION_SPRAY | Freq: Four times a day (QID) | RESPIRATORY_TRACT | Status: DC | PRN

## 2013-10-07 NOTE — Progress Notes (Signed)
Patient presents to clinic today c/o sinus pressure, sinus pain, ear pain, tooth pain and intermittent fevers.  Sinus pain is worse on the right side of the face.  Denies shortness of breath, wheezing or pleuritic chest pain.  Denies recent travel or sick contact.  Endorses significant sore throat over the past 2 days.  Past Medical History  Diagnosis Date  . Seasonal allergies     Current Outpatient Prescriptions on File Prior to Visit  Medication Sig Dispense Refill  . acetaminophen (TYLENOL) 500 MG tablet Take 500 mg by mouth every 6 (six) hours as needed.      Marland Kitchen escitalopram (LEXAPRO) 10 MG tablet Take 0.5 tablets (5 mg total) by mouth daily.  45 tablet  1  . loratadine (CLARITIN) 10 MG tablet Take 10 mg by mouth daily as needed for allergies.       No current facility-administered medications on file prior to visit.    Allergies  Allergen Reactions  . Amoxicillin-Pot Clavulanate   . Prozac [Fluoxetine Hcl] Other (See Comments)    Mood swing, angry, increased depression    Family History  Problem Relation Age of Onset  . Adopted: Yes    History   Social History  . Marital Status: Married    Spouse Name: N/A    Number of Children: N/A  . Years of Education: N/A   Social History Main Topics  . Smoking status: Former Games developer  . Smokeless tobacco: Never Used  . Alcohol Use: Yes     Comment: occasional glass of wine  . Drug Use: None  . Sexual Activity: None   Other Topics Concern  . 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  All other ROS are negative.  BP 125/70  Pulse 80  Temp(Src) 98.2 F (36.8 C)  Wt 202 lb (91.627 kg)  SpO2 99%  Physical Exam  Vitals reviewed. Constitutional: He is oriented to person, place, and time and well-developed, well-nourished, and in no distress.  HENT:  Head: Normocephalic and  atraumatic.  Right Ear: Tympanic membrane, external ear and ear canal normal.  Left Ear: Tympanic membrane, external ear and ear canal normal.  Nose: Right sinus exhibits maxillary sinus tenderness and frontal sinus tenderness. Left sinus exhibits no maxillary sinus tenderness and no frontal sinus tenderness.  Mouth/Throat: Uvula is midline, oropharynx is clear and moist and mucous membranes are normal.  Eyes: Conjunctivae are normal.  Neck: Neck supple.  Cardiovascular: Normal rate, regular rhythm, normal heart sounds and intact distal pulses.   Pulmonary/Chest: Effort normal and breath sounds normal. No respiratory distress. He has no wheezes. He has no rales. He exhibits no tenderness.  Lymphadenopathy:    He has no cervical adenopathy.  Neurological: He is alert and oriented to person, place, and time.  Skin: Skin is warm and dry. No rash noted.  Psychiatric: Affect normal.   Recent Results (from the past 2160 hour(s))  CBC     Status: Abnormal   Collection Time    08/18/13 10:40 AM      Result Value Ref Range   WBC 5.8  4.0 - 10.5 K/uL   RBC 4.99  4.22 - 5.81 MIL/uL   Hemoglobin 15.6  13.0 - 17.0 g/dL   HCT 16.1  09.6 -  52.0 %   MCV 86.0  78.0 - 100.0 fL   MCH 31.3  26.0 - 34.0 pg   MCHC 36.4 (*) 30.0 - 36.0 g/dL   RDW 16.1  09.6 - 04.5 %   Platelets 241  150 - 400 K/uL  SEDIMENTATION RATE     Status: None   Collection Time    08/18/13 10:40 AM      Result Value Ref Range   Sed Rate 1  0 - 16 mm/hr  ANA     Status: None   Collection Time    08/18/13 10:40 AM      Result Value Ref Range   ANA NEG  NEGATIVE  RPR     Status: None   Collection Time    08/18/13 10:40 AM      Result Value Ref Range   RPR NON REAC  NON REAC  B. BURGDORFI ANTIBODIES     Status: None   Collection Time    08/18/13 10:40 AM      Result Value Ref Range   B burgdorferi Ab IgG+IgM 0.44     Comment: Antibody to Borrelia burgdorferi not detected.              ISR = Immune Status Ratio                   <0.90         ISR       Negative                  0.90 - 1.09   ISR       Equivocal                  >=1.10        ISR       Positive  ROCKY MTN SPOTTED FVR ABS PNL(IGG+IGM)     Status: None   Collection Time    08/18/13 10:40 AM      Result Value Ref Range   RMSF IgG 0.16     RMSF IgM 0.36     Comment:       IV = Index Value           REFERENCE INTERVAL     [facility] Spotted Fever Antibody, IgG, IgM      IgG (IV)    IgM (IV)   Result           Interpretation:     ---------   ---------  ---------         ---------------     <0.80       <0.90      Negative    No significant level of Rickettsia                                        ricketsii IgG and/or IgM antibody                                        detected. If clinically indicated,                                        repeat sample within 7-14 days.  0.80-1.20   0.90-1.10  Equivocal   Questionable presence of Rickettsia                                        rickettsii IgG and/or IgM antibody                                        detected. Recommend recollecting                                        and retesting, if clinically                                        indicated.     >1.20       >1.10      Positive    Presence of IgG and/or IgM antibody                                        to Rickettsia rickettsii detected.                                        IgG indicates evidence of prior                                        infection and may not indicate                                        active disease.  IgM suggestive                                        of current or recent infection.     ThE RMSF IgM test was validated and its performance characteristics     determined by [employer].  It has not been cleared     or approved by the U.S. Food and Drug Administration.  The FDA has     determined that such clearance or approval is not necessary.  This     test is used for clinical  purposes.  It should not be regarded as     investigational or for research.  HIV ANTIBODY (ROUTINE TESTING)     Status: None   Collection Time    08/18/13 10:40 AM      Result Value Ref Range   HIV 1&2 Ab, 4th Generation NONREACTIVE  NONREACTIVE   Comment:       A NONREACTIVE HIV Ag/Ab result does not exclude HIV infection since     the time frame for seroconversion is variable. If acute HIV infection     is suspected, a HIV-1 RNA Qualitative TMA  test is recommended.           HIV-1/2 Antibody Diff         Not indicated.     HIV-1 RNA, Qual TMA           Not indicated.           PLEASE NOTE: This information has been disclosed to you from records     whose confidentiality may be protected by state law. If your state     requires such protection, then the state law prohibits you from making     any further disclosure of the information without the specific written     consent of the person to whom it pertains, or as otherwise permitted     by law. A general authorization for the release of medical or other     information is NOT sufficient for this purpose.           The performance of this assay has not been clinically validated in     patients less than 30 years old.  POCT RAPID STREP A (OFFICE)     Status: None   Collection Time    10/07/13 12:26 PM      Result Value Ref Range   Rapid Strep A Screen Negative  Negative    Assessment/Plan: Acute bacterial sinusitis Rapid strep negative.  Suspect bacterial sinusitis giving significant sinus pain and TTP.  Rx Bactrim. Increase fluid intake.  Rest.  Saline nasal spray.  Probiotic. Tylenol for sore throat. Rx Albuterol for wheeze.  Return precautions discussed with patient.

## 2013-10-07 NOTE — Assessment & Plan Note (Signed)
Rapid strep negative.  Suspect bacterial sinusitis giving significant sinus pain and TTP.  Rx Bactrim. Increase fluid intake.  Rest.  Saline nasal spray.  Probiotic. Tylenol for sore throat. Rx Albuterol for wheeze.  Return precautions discussed with patient.

## 2013-10-07 NOTE — Telephone Encounter (Signed)
Ok to send 1 refill.

## 2013-10-07 NOTE — Progress Notes (Signed)
Pre visit review using our clinic review tool, if applicable. No additional management support is needed unless otherwise documented below in the visit note. 

## 2013-10-07 NOTE — Patient Instructions (Signed)
Please take antibiotic as directed.  Increase fluid intake.  Use Saline nasal spray.  Take a daily multivitamin. Continue Claritin and Mucinex.  Place a humidifier in the bedroom.  Please call or return clinic if symptoms are not improving.  Sinusitis Sinusitis is redness, soreness, and swelling (inflammation) of the paranasal sinuses. Paranasal sinuses are air pockets within the bones of your face (beneath the eyes, the middle of the forehead, or above the eyes). In healthy paranasal sinuses, mucus is able to drain out, and air is able to circulate through them by way of your nose. However, when your paranasal sinuses are inflamed, mucus and air can become trapped. This can allow bacteria and other germs to grow and cause infection. Sinusitis can develop quickly and last only a short time (acute) or continue over a long period (chronic). Sinusitis that lasts for more than 12 weeks is considered chronic.  CAUSES  Causes of sinusitis include:  Allergies.  Structural abnormalities, such as displacement of the cartilage that separates your nostrils (deviated septum), which can decrease the air flow through your nose and sinuses and affect sinus drainage.  Functional abnormalities, such as when the small hairs (cilia) that line your sinuses and help remove mucus do not work properly or are not present. SYMPTOMS  Symptoms of acute and chronic sinusitis are the same. The primary symptoms are pain and pressure around the affected sinuses. Other symptoms include:  Upper toothache.  Earache.  Headache.  Bad breath.  Decreased sense of smell and taste.  A cough, which worsens when you are lying flat.  Fatigue.  Fever.  Thick drainage from your nose, which often is green and may contain pus (purulent).  Swelling and warmth over the affected sinuses. DIAGNOSIS  Your caregiver will perform a physical exam. During the exam, your caregiver may:  Look in your nose for signs of abnormal growths  in your nostrils (nasal polyps).  Tap over the affected sinus to check for signs of infection.  View the inside of your sinuses (endoscopy) with a special imaging device with a light attached (endoscope), which is inserted into your sinuses. If your caregiver suspects that you have chronic sinusitis, one or more of the following tests may be recommended:  Allergy tests.  Nasal culture A sample of mucus is taken from your nose and sent to a lab and screened for bacteria.  Nasal cytology A sample of mucus is taken from your nose and examined by your caregiver to determine if your sinusitis is related to an allergy. TREATMENT  Most cases of acute sinusitis are related to a viral infection and will resolve on their own within 10 days. Sometimes medicines are prescribed to help relieve symptoms (pain medicine, decongestants, nasal steroid sprays, or saline sprays).  However, for sinusitis related to a bacterial infection, your caregiver will prescribe antibiotic medicines. These are medicines that will help kill the bacteria causing the infection.  Rarely, sinusitis is caused by a fungal infection. In theses cases, your caregiver will prescribe antifungal medicine. For some cases of chronic sinusitis, surgery is needed. Generally, these are cases in which sinusitis recurs more than 3 times per year, despite other treatments. HOME CARE INSTRUCTIONS   Drink plenty of water. Water helps thin the mucus so your sinuses can drain more easily.  Use a humidifier.  Inhale steam 3 to 4 times a day (for example, sit in the bathroom with the shower running).  Apply a warm, moist washcloth to your face 3 to  4 times a day, or as directed by your caregiver.  Use saline nasal sprays to help moisten and clean your sinuses.  Take over-the-counter or prescription medicines for pain, discomfort, or fever only as directed by your caregiver. SEEK IMMEDIATE MEDICAL CARE IF:  You have increasing pain or severe  headaches.  You have nausea, vomiting, or drowsiness.  You have swelling around your face.  You have vision problems.  You have a stiff neck.  You have difficulty breathing. MAKE SURE YOU:   Understand these instructions.  Will watch your condition.  Will get help right away if you are not doing well or get worse. Document Released: 01/20/2005 Document Revised: 04/14/2011 Document Reviewed: 02/04/2011 [employer] Patient Information 2014 [employer].    

## 2013-10-07 NOTE — Telephone Encounter (Signed)
Rx called to pharmacy voicemail. 

## 2013-11-03 ENCOUNTER — Other Ambulatory Visit: Payer: Self-pay | Admitting: Family

## 2013-11-04 NOTE — Telephone Encounter (Signed)
Rx last printed 10/07/13.  Rx printed and forwarded to PRovider for signature.  Pt last seen for acute visit on 10/07/13, but has no future follow ups with PCP.  When should pt return to the office for f/u?

## 2013-11-04 NOTE — Telephone Encounter (Signed)
Please call pt to arrange appt. 

## 2013-11-04 NOTE — Telephone Encounter (Signed)
Needs 6 month follow up

## 2013-11-04 NOTE — Telephone Encounter (Signed)
Rx faxed to pharmacy  

## 2013-11-04 NOTE — Telephone Encounter (Signed)
Please advise re: f/u.

## 2013-11-07 NOTE — Telephone Encounter (Signed)
Left detailed message informing patient of this and to call our office to schedule 6 month follow up

## 2013-12-01 ENCOUNTER — Other Ambulatory Visit: Payer: Self-pay | Admitting: Family

## 2013-12-01 ENCOUNTER — Telehealth: Payer: Self-pay | Admitting: *Deleted

## 2013-12-01 MED ORDER — ALPRAZOLAM 0.25 MG PO TABS: ORAL_TABLET | ORAL | Status: DC

## 2013-12-01 NOTE — Telephone Encounter (Signed)
rx refill- xanax 0.25mg  Last OV- 10/04/13 Last refilled- 11/04/13 #60 / 0 rf No UDS

## 2013-12-01 NOTE — Telephone Encounter (Signed)
rx signed by @ [facility] and faxed to pts [facility] pharmacy.

## 2013-12-01 NOTE — Telephone Encounter (Signed)
Ok to send 60 tabs zero refills. 

## 2013-12-04 ENCOUNTER — Other Ambulatory Visit: Payer: Self-pay | Admitting: Family

## 2013-12-05 ENCOUNTER — Telehealth: Payer: Self-pay | Admitting: *Deleted

## 2013-12-05 MED ORDER — ALPRAZOLAM 0.25 MG PO TABS: ORAL_TABLET | ORAL | Status: DC

## 2013-12-05 NOTE — Telephone Encounter (Signed)
OK to send 60 tabs zero refill.

## 2013-12-05 NOTE — Telephone Encounter (Signed)
Rx called to pharmacy voicemail as below. 

## 2013-12-05 NOTE — Telephone Encounter (Signed)
Rx refill- xanax .25mg  Last OV- 10/04/13 Last refilled- 12/01/13 #60 / 0 rf  uds- none.

## 2014-01-24 ENCOUNTER — Other Ambulatory Visit: Payer: Self-pay | Admitting: Family

## 2014-01-24 NOTE — Telephone Encounter (Signed)
Ok to send 60 tabs zero refills. 

## 2014-01-24 NOTE — Telephone Encounter (Signed)
Rx called to pharmacy voicemail. 

## 2014-02-21 ENCOUNTER — Other Ambulatory Visit: Payer: Self-pay | Admitting: Family

## 2014-02-21 NOTE — Telephone Encounter (Signed)
Medication Detail      Disp Refills Start End     ALPRAZolam (XANAX) 0.25 MG tablet 60 tablet 0 01/24/2014     Sig: TAKE 1 TABLET BY MOUTH TWICE DAILY AS NEEDED FOR SLEEP OR ANXIETY    Class: Phone In    LAST OV: 09.01.15 FOLLOW-UP: 6-Mths Please Advise on refills/SLS

## 2014-02-22 NOTE — Telephone Encounter (Signed)
Rx called to pharmacy voicemail as below. 

## 2014-02-22 NOTE — Telephone Encounter (Signed)
OK to send 60 tabs with zero refills.  

## 2014-03-30 ENCOUNTER — Other Ambulatory Visit: Payer: Self-pay | Admitting: Family

## 2014-03-30 NOTE — Telephone Encounter (Signed)
Ok to send 60 tabs zero refills. 

## 2014-03-30 NOTE — Telephone Encounter (Signed)
Refill called to pharmacy voicemail. 

## 2014-05-03 ENCOUNTER — Other Ambulatory Visit: Payer: Self-pay | Admitting: Family

## 2014-05-03 NOTE — Telephone Encounter (Signed)
Rx called to pharmacy voicemail as below. Please call pt to arrange follow up as soon as possible. Further refills cannot be given until pt is seen in the office. Thanks!

## 2014-05-03 NOTE — Telephone Encounter (Signed)
Informed patient of refill and he states that he will have to call back to schedule appointment

## 2014-05-03 NOTE — Telephone Encounter (Signed)
Ok for #30, please advise pt that they are due for f/u appt

## 2014-05-03 NOTE — Telephone Encounter (Signed)
Pt last seen by PCP 10/2013 and advised 6 month f/u. Pt is due now. Last rx given 03/30/14.  Please advise?   Medication name:  Name from pharmacy:  ALPRAZolam (XANAX) 0.25 MG tablet ALPRAZOLAM 0.25MG  TABLETS     Sig: TAKE 1 TABLET BY MOUTH TWICE DAILY AS NEEDED FOR ANXIETY OR SLEEP    Dispense: 60 tablet   Refills: 0   Start: 05/03/2014   Class: Normal    Requested on: 05/03/2014    Originally ordered on: 03/18/2013 03/30/2014

## 2014-06-16 ENCOUNTER — Ambulatory Visit (INDEPENDENT_AMBULATORY_CARE_PROVIDER_SITE_OTHER): Payer: Managed Care, Other (non HMO) | Admitting: Family

## 2014-06-16 ENCOUNTER — Encounter: Payer: Self-pay | Admitting: Family

## 2014-06-16 VITALS — BP 120/80 | HR 54 | Temp 97.8°F | Resp 16 | Ht 75.0 in | Wt 208.2 lb

## 2014-06-16 DIAGNOSIS — F419 Anxiety disorder, unspecified: Principal | ICD-10-CM

## 2014-06-16 DIAGNOSIS — F418 Other specified anxiety disorders: Secondary | ICD-10-CM | POA: Diagnosis not present

## 2014-06-16 DIAGNOSIS — F329 Major depressive disorder, single episode, unspecified: Secondary | ICD-10-CM

## 2014-06-16 MED ORDER — ALPRAZOLAM 0.25 MG PO TABS: ORAL_TABLET | ORAL | Status: DC

## 2014-06-16 MED ORDER — VENLAFAXINE HCL ER 37.5 MG PO CP24: ORAL_CAPSULE | ORAL | Status: DC

## 2014-06-16 NOTE — Progress Notes (Signed)
Pre visit review using our clinic review tool, if applicable. No additional management support is needed unless otherwise documented below in the visit note. 

## 2014-06-16 NOTE — Patient Instructions (Signed)
Start effexor one tab once daily for 3 days, then increase to 2 tabs once daily as tolerated. Follow up in 1 month.

## 2014-06-16 NOTE — Progress Notes (Signed)
   Subjective:    Patient ID: Jimmy Hardin, male    DOB: 11/08/1975, 39 y.o.   MRN: [id number]  HPI   Anxiety- stopped lexapro November due to lack of insurance.  Notes that he and his wife split around the same time.  Has tried prozac in the past which caused SI.   Reports that exercise helps with his anxiety. Notes extreme apathy. Thinks he is depressed. Denies SI/HI, but reports that he sometimes wonders, "what is the point." Reports that he has been needing more xanax since he stopped the prozac.     Review of Systems    see HPI  Past Medical History  Diagnosis Date  . Seasonal allergies     History   Social History  . Marital Status: Married    Spouse Name: N/A  . Number of Children: N/A  . Years of Education: N/A   Occupational History  . Not on file.   Social History Main Topics  . Smoking status: Former Games developermoker  . Smokeless tobacco: Never Used  . Alcohol Use: Yes     Comment: occasional glass of wine  . Drug Use: Not on file  . Sexual Activity: Not on file   Other Topics Concern  . 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 Esophagogastroduodenoscopy endoscopy  2014    told inflammatory bowel and not to eat gluten    Family History  Problem Relation Age of Onset  . Adopted: Yes    Allergies  Allergen Reactions  . Amoxicillin-Pot Clavulanate   . Prozac [Fluoxetine Hcl] Other (See Comments)    Mood swing, angry, increased depression    Current Outpatient Prescriptions on File Prior to Visit  Medication Sig Dispense Refill  . acetaminophen (TYLENOL) 500 MG tablet Take 500 mg by mouth every 6 (six) hours as needed.     No current facility-administered medications on file prior to visit.    BP 120/80 mmHg  Pulse 54  Temp(Src) 97.8 F (36.6 C) (Oral)  Resp 16   Ht 6\' 3"  (1.905 m)  Wt 208 lb 3.2 oz (94.439 kg)  BMI 26.02 kg/m2  SpO2 99%    Objective:   Physical Exam  Constitutional: He appears well-developed and well-nourished. No distress.  Psychiatric:  Slightly flat affect          Assessment & Plan:  15 min spent with pt today.  >50% of this time was spent counseling pt on anxiety/depression

## 2014-06-16 NOTE — Assessment & Plan Note (Signed)
Now with worsening anxiety sx's off of lexapro and some depression. Will initiate effexor, discussed common side effects.  Pt is instructed to call if mood changes/concerns after starting medication. Refill provided for xanax.  Pt was also given info on therapist.

## 2014-07-18 ENCOUNTER — Other Ambulatory Visit: Payer: Self-pay | Admitting: Family

## 2014-07-18 NOTE — Telephone Encounter (Signed)
Pt has follow up 07/26/14.  No previous CSC or UDS obtained. Last alprazolam Rx 06/17/14.  Rx printed and forwarded to covering Provider, Laury Axon for signature.

## 2014-07-18 NOTE — Telephone Encounter (Signed)
Rx faxed to pharmacy at 2:52pm.

## 2014-07-26 ENCOUNTER — Encounter: Payer: Self-pay | Admitting: Family

## 2014-07-26 ENCOUNTER — Ambulatory Visit (INDEPENDENT_AMBULATORY_CARE_PROVIDER_SITE_OTHER): Payer: Managed Care, Other (non HMO) | Admitting: Family

## 2014-07-26 VITALS — BP 110/70 | HR 59 | Temp 97.6°F | Ht 75.0 in | Wt 210.2 lb

## 2014-07-26 DIAGNOSIS — F418 Other specified anxiety disorders: Secondary | ICD-10-CM | POA: Diagnosis not present

## 2014-07-26 DIAGNOSIS — F419 Anxiety disorder, unspecified: Principal | ICD-10-CM

## 2014-07-26 DIAGNOSIS — F329 Major depressive disorder, single episode, unspecified: Secondary | ICD-10-CM

## 2014-07-26 DIAGNOSIS — F32A Depression, unspecified: Secondary | ICD-10-CM

## 2014-07-26 MED ORDER — ALPRAZOLAM 0.5 MG PO TABS: ORAL_TABLET | ORAL | Status: DC

## 2014-07-26 NOTE — Progress Notes (Signed)
   Subjective:    Patient ID: Jimmy Hardin, male    DOB: 1975-09-18, 39 y.o.   MRN: [id number]  HPI  Mr. Fullbright is a 39 yr old male who presents today for follow up of his anxiety.  Last vist he was started on effexor.  Felt "absent" on two tabs of the effexor so he dropped down to one tab. Not sleeping well despite xanax.  Notes little improvement on one tab of effexor.  Continues to anxiety. Becomes overwhelmed in certain situations, easily irritated.  Didn't like the way he felt on lexapro.  Uses xanax sparingly during the days.    Review of Systems See HPI  Past Medical History  Diagnosis Date  . Seasonal allergies     History   Social History  . Marital Status: Married    Spouse Name: N/A  . Number of Children: N/A  . Years of Education: N/A   Occupational History  . Not on file.   Social History Main Topics  . Smoking status: Former Games developer  . Smokeless tobacco: Never Used  . Alcohol Use: Yes     Comment: occasional glass of wine  . Drug Use: Not on file  . Sexual Activity: Not on file   Other Topics Concern  . 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 Esophagogastroduodenoscopy endoscopy  2014    told inflammatory bowel and not to eat gluten    Family History  Problem Relation Age of Onset  . Adopted: Yes    Allergies  Allergen Reactions  . Amoxicillin-Pot Clavulanate   . Prozac [Fluoxetine Hcl] Other (See Comments)    Mood swing, angry, increased depression    Current Outpatient Prescriptions on File Prior to Visit  Medication Sig Dispense Refill  . acetaminophen (TYLENOL) 500 MG tablet Take 500 mg by mouth every 6 (six) hours as needed.    . venlafaxine XR (EFFEXOR XR) 37.5 MG 24 hr capsule Start one tab by mouth once daily for 3 days, then increase to two tabs  once daily (Patient taking differently: Two tabs by mouth once daily) 60 capsule 1   No current facility-administered medications on file prior to visit.    BP 110/70 mmHg  Pulse 59  Temp(Src) 97.6 F (36.4 C) (Oral)  Ht 6\' 3"  (1.905 m)  Wt 210 lb 3.2 oz (95.346 kg)  BMI 26.27 kg/m2  SpO2 99%       Objective:   Physical Exam  Constitutional: He appears well-developed and well-nourished. No distress.  Psychiatric: His behavior is normal. Judgment and thought content normal.  Slightly flat affect          Assessment & Plan:  15 min spent with pt today. >50% of this time was spent counseling pt on anxiety/depression

## 2014-07-26 NOTE — Patient Instructions (Signed)
Increase effexor to 2 tabs once daily. Follow up in 6 weeks.

## 2014-07-26 NOTE — Assessment & Plan Note (Signed)
Uncontrolled.  He is willing to retry the two tabs of effexor now that he has been on it for a while.  I wouldn't expect much clinical response at the 37.5 mg dose.  Continue xanax HS prn sleep. Hopefully if we can get his anxiety under better control, then he will be able to sleep better.  He has tried [place] in the past and did not like it.

## 2014-08-18 ENCOUNTER — Other Ambulatory Visit: Payer: Self-pay | Admitting: Family

## 2014-09-06 ENCOUNTER — Encounter: Payer: Self-pay | Admitting: Family

## 2014-09-06 ENCOUNTER — Ambulatory Visit (INDEPENDENT_AMBULATORY_CARE_PROVIDER_SITE_OTHER): Payer: Managed Care, Other (non HMO) | Admitting: Family

## 2014-09-06 VITALS — BP 110/80 | HR 65 | Temp 97.8°F | Resp 16 | Ht 75.0 in | Wt 215.4 lb

## 2014-09-06 DIAGNOSIS — K219 Gastro-esophageal reflux disease without esophagitis: Secondary | ICD-10-CM

## 2014-09-06 DIAGNOSIS — K5900 Constipation, unspecified: Secondary | ICD-10-CM

## 2014-09-06 DIAGNOSIS — N529 Male erectile dysfunction, unspecified: Secondary | ICD-10-CM | POA: Diagnosis not present

## 2014-09-06 DIAGNOSIS — R635 Abnormal weight gain: Secondary | ICD-10-CM

## 2014-09-06 DIAGNOSIS — F329 Major depressive disorder, single episode, unspecified: Secondary | ICD-10-CM

## 2014-09-06 DIAGNOSIS — F32A Depression, unspecified: Secondary | ICD-10-CM

## 2014-09-06 DIAGNOSIS — F418 Other specified anxiety disorders: Secondary | ICD-10-CM | POA: Diagnosis not present

## 2014-09-06 DIAGNOSIS — F419 Anxiety disorder, unspecified: Secondary | ICD-10-CM

## 2014-09-06 LAB — TSH: TSH: 2.25 u[IU]/mL (ref 0.35–4.50)

## 2014-09-06 MED ORDER — ALPRAZOLAM 0.5 MG PO TABS: ORAL_TABLET | ORAL | Status: DC

## 2014-09-06 MED ORDER — SILDENAFIL CITRATE 50 MG PO TABS: 50.0000 mg | ORAL_TABLET | Freq: Every day | ORAL | Status: DC | PRN

## 2014-09-06 MED ORDER — VENLAFAXINE HCL ER 37.5 MG PO CP24: ORAL_CAPSULE | ORAL | Status: DC

## 2014-09-06 NOTE — Assessment & Plan Note (Signed)
Recommend prn zantac.

## 2014-09-06 NOTE — Assessment & Plan Note (Signed)
Stable on current dose of effexor.  Continue same. Rx provided for xanax.

## 2014-09-06 NOTE — Progress Notes (Signed)
Pre visit review using our clinic review tool, if applicable. No additional management support is needed unless otherwise documented below in the visit note. 

## 2014-09-06 NOTE — Progress Notes (Signed)
Subjective:     Patient ID: Jimmy Hardin, male   DOB: 02/11/1975, 39 y.o.   MRN: [id number]  HPI  Jimmy Hardin is a 39 yr old male who presents today for follow up.  1) Anxiety/Depression-  Last visit we discussed increasing his effexor up to 2 tabs.  Notes improvement in anxiety.  Does has side effects GERD and constipation.  Tried prilosec and zanatac.  He does note some ED since starting effexor.  Wishes to continue medication.   2) Heartburn- Has had intermittent GERD symptoms. Hast tried otc zantac prn.  3) Constipation- Last BM was 3 days ago.    4) Weight Gain-  Wt Readings from Last 3 Encounters:  09/06/14 215 lb 6.4 oz (97.705 kg)  07/26/14 210 lb 3.2 oz (95.346 kg)  06/16/14 208 lb 3.2 oz (94.439 kg)    Review of Systems See HPI  Past Medical History  Diagnosis Date  . Seasonal allergies     History   Social History  . Marital Status: Married    Spouse Name: N/A  . Number of Children: N/A  . Years of Education: N/A   Occupational History  . Not on file.   Social History Main Topics  . Smoking status: Former Games developer  . Smokeless tobacco: Never Used  . Alcohol Use: Yes     Comment: occasional glass of wine  . Drug Use: Not on file  . Sexual Activity: Not on file   Other Topics Concern  . 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 Esophagogastroduodenoscopy endoscopy  2014    told inflammatory bowel and not to eat gluten    Family History  Problem Relation Age of Onset  . Adopted: Yes    Allergies  Allergen Reactions  . Amoxicillin-Pot Clavulanate   . Prozac [Fluoxetine Hcl] Other (See Comments)    Mood swing, angry, increased depression    Current Outpatient Prescriptions on File Prior to Visit  Medication Sig Dispense Refill  . acetaminophen (TYLENOL) 500  MG tablet Take 500 mg by mouth every 6 (six) hours as needed.     No current facility-administered medications on file prior to visit.    BP 110/80 mmHg  Pulse 65  Temp(Src) 97.8 F (36.6 C) (Oral)  Resp 16  Ht 6\' 3"  (1.905 m)  Wt 215 lb 6.4 oz (97.705 kg)  BMI 26.92 kg/m2  SpO2 99%       Objective:   Physical Exam  Constitutional: He is oriented to person, place, and time. He appears well-developed and well-nourished. No distress.  HENT:  Head: Normocephalic and atraumatic.  Cardiovascular: Normal rate and regular rhythm.   No murmur heard. Pulmonary/Chest: Effort normal and breath sounds normal. No respiratory distress. He has no wheezes. He has no rales.  Musculoskeletal: He exhibits no edema.  Neurological: He is alert and oriented to person, place, and time.  Skin: Skin is warm and dry.  Psychiatric: He has a normal mood and affect. His behavior is normal. Thought content normal.       Assessment:         Plan:     Constipation- trial of miralax  Weight gain- likely secondary to being less active.  Will check tsh to  rule out hypothyroid.

## 2014-09-06 NOTE — Patient Instructions (Addendum)
You may use miralax 1 cap in 8 oz of juice today. Then as needed.  You may use zantac as needed.   Please complete lab work prior to leaving.

## 2014-09-06 NOTE — Assessment & Plan Note (Signed)
Trial of prn viagra.   

## 2014-11-06 ENCOUNTER — Other Ambulatory Visit: Payer: Self-pay | Admitting: Family

## 2014-11-06 NOTE — Telephone Encounter (Signed)
Ok to send 30 tabs zero refills. 

## 2014-11-06 NOTE — Telephone Encounter (Signed)
Rx called to pharmacy voicemail. 

## 2014-11-20 ENCOUNTER — Other Ambulatory Visit: Payer: Self-pay | Admitting: Family

## 2014-12-06 ENCOUNTER — Ambulatory Visit: Payer: Managed Care, Other (non HMO) | Admitting: Family

## 2014-12-14 ENCOUNTER — Ambulatory Visit (INDEPENDENT_AMBULATORY_CARE_PROVIDER_SITE_OTHER): Payer: Managed Care, Other (non HMO) | Admitting: Family

## 2014-12-14 ENCOUNTER — Encounter: Payer: Self-pay | Admitting: Family

## 2014-12-14 VITALS — BP 129/77 | HR 63 | Temp 98.6°F | Resp 16 | Ht 75.0 in | Wt 236.8 lb

## 2014-12-14 DIAGNOSIS — R21 Rash and other nonspecific skin eruption: Secondary | ICD-10-CM

## 2014-12-14 DIAGNOSIS — R635 Abnormal weight gain: Secondary | ICD-10-CM | POA: Diagnosis not present

## 2014-12-14 DIAGNOSIS — F418 Other specified anxiety disorders: Secondary | ICD-10-CM

## 2014-12-14 DIAGNOSIS — F419 Anxiety disorder, unspecified: Secondary | ICD-10-CM

## 2014-12-14 DIAGNOSIS — F329 Major depressive disorder, single episode, unspecified: Secondary | ICD-10-CM

## 2014-12-14 DIAGNOSIS — J029 Acute pharyngitis, unspecified: Secondary | ICD-10-CM

## 2014-12-14 MED ORDER — ALPRAZOLAM 0.5 MG PO TABS: 0.5000 mg | ORAL_TABLET | Freq: Every evening | ORAL | Status: DC | PRN

## 2014-12-14 NOTE — Progress Notes (Signed)
Subjective:    Patient ID: Jimmy Hardin, male    DOB: Jan 26, 1976, 39 y.o.   MRN: [id number]  HPI   Jimmy Hardin is a 39 yr old male who presents today for follow up.  1) Anxiety/depression-  mainatained on effexor and xanax. (uses xanax HS for sleep) Not on lexapro.   Wt Readings from Last 3 Encounters:  12/14/14 236 lb 12.8 oz (107.412 kg)  09/06/14 215 lb 6.4 oz (97.705 kg)  07/26/14 210 lb 3.2 oz (95.346 kg)   2) Rash- reports rash on both inner thighs x 1 month.  3) Sore throat- reports sort throat this AM, + increased ear wax.     Review of Systems See HPI  Past Medical History  Diagnosis Date  . Seasonal allergies     Social History   Social History  . Marital Status: Married    Spouse Name: N/A  . Number of Children: N/A  . Years of Education: N/A   Occupational History  . Not on file.   Social History Main Topics  . Smoking status: Former Games developer  . Smokeless tobacco: Never Used  . Alcohol Use: Yes     Comment: occasional glass of wine  . Drug Use: Not on file  . Sexual Activity: Not on file   Other Topics Concern  . 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 Esophagogastroduodenoscopy endoscopy  2014    told inflammatory bowel and not to eat gluten    Family History  Problem Relation Age of Onset  . Adopted: Yes    Allergies  Allergen Reactions  . Amoxicillin-Pot Clavulanate   . Prozac [Fluoxetine Hcl] Other (See Comments)    Mood swing, angry, increased depression    Current Outpatient Prescriptions on File Prior to Visit  Medication Sig Dispense Refill  . acetaminophen (TYLENOL) 500 MG tablet Take 500 mg by mouth every 6 (six) hours as needed.    . ALPRAZolam (XANAX) 0.25 MG tablet TAKE 1 TABLET BY MOUTH TWICE DAILY AS NEEDED FOR ANXIETY OR SLEEP 30  tablet 0  . escitalopram (LEXAPRO) 10 MG tablet TAKE 1 TABLET BY MOUTH DAILY 30 tablet 0  . sildenafil (VIAGRA) 50 MG tablet Take 1 tablet (50 mg total) by mouth daily as needed for erectile dysfunction. 10 tablet 2  . venlafaxine XR (EFFEXOR-XR) 37.5 MG 24 hr capsule Take 2 tabs by mouth once daily 60 capsule 5   No current facility-administered medications on file prior to visit.    BP 129/77 mmHg  Pulse 63  Temp(Src) 98.6 F (37 C) (Oral)  Resp 16  Ht  (1.905 m)  Wt 236 lb 12.8 oz (107.412 kg)  BMI 29.60 kg/m2  SpO2 100%       Objective:   Physical Exam  Constitutional: He is oriented to person, place, and time. He appears well-developed and well-nourished. No distress.  HENT:  Head: Normocephalic and atraumatic.  Right Ear: Tympanic membrane and ear canal normal.  Left Ear: Tympanic membrane and ear canal normal.  Mouth/Throat: Posterior oropharyngeal erythema present. No oropharyngeal exudate or posterior oropharyngeal edema.  Cardiovascular: Normal rate and regular rhythm.   No murmur heard. Pulmonary/Chest: Effort normal and breath sounds normal. No respiratory distress. He  has no wheezes. He has no rales.  Musculoskeletal: He exhibits no edema.  Lymphadenopathy:    He has no cervical adenopathy.  Neurological: He is alert and oriented to person, place, and time.  Skin: Skin is warm and dry.  Raised annular lesions noted in groin  Psychiatric: He has a normal mood and affect. His behavior is normal. Thought content normal.          Assessment & Plan:  Fungal rash- groin. Trial of lotrimin  Weight gain- advised pt: Try to keep track of your calories and exercise using myfitness pal app.  Goal weight loss is 1 pound a week. Lab Results  Component Value Date   TSH 2.25 09/06/2014   TSH was normal last visit. He has been off of SSRI.    Viral pharyngitis- mild- monitor.

## 2014-12-14 NOTE — Progress Notes (Signed)
Pre visit review using our clinic review tool, if applicable. No additional management support is needed unless otherwise documented below in the visit note. 

## 2014-12-14 NOTE — Patient Instructions (Addendum)
Apply lotrimin twice daily to inner thighs until rash resolved. Continue effexor and xanax.  Try to keep track of your calories and exercise using myfitness pal app.  Goal weight loss is 1 pound a week.

## 2014-12-14 NOTE — Assessment & Plan Note (Addendum)
Notes worsening insomnia despite xanax 0.25 qhs, increase xanax to 0.5mg .  Cont effexor

## 2014-12-29 ENCOUNTER — Other Ambulatory Visit: Payer: Self-pay | Admitting: Family

## 2015-01-02 ENCOUNTER — Telehealth: Payer: Self-pay | Admitting: Family

## 2015-01-02 NOTE — Telephone Encounter (Signed)
Relation to MV:HQIOpt:self  Call back number:[phone] [facility] [id number] [place] - [address] [facility] [address] [facility] [address] [phone] ([phone])         Reason for call:  Patient states he is experiencing moods swings and anxiety due to venlafaxine XR (EFFEXOR-XR) 37.5 MG 24 hr capsule patient would like NP to recommend escitalopram (LEXAPRO) 10 MG tablet  or another medication.

## 2015-01-03 NOTE — Telephone Encounter (Signed)
Pt called in. Scheduled for appt 01/09/15.

## 2015-01-03 NOTE — Telephone Encounter (Signed)
Lets bring him back in to discuss med management worsening anxiety etc.

## 2015-01-09 ENCOUNTER — Telehealth: Payer: Self-pay | Admitting: Family

## 2015-01-09 ENCOUNTER — Ambulatory Visit: Payer: Managed Care, Other (non HMO) | Admitting: Family

## 2015-01-10 NOTE — Telephone Encounter (Signed)
Pt was no show 01/09/15, pt called in to reschedule and states that he couldn't get out of work, coming in 01/12/15, charge or no charge?

## 2015-01-11 NOTE — Telephone Encounter (Signed)
No charge. 

## 2015-01-12 ENCOUNTER — Encounter: Payer: Self-pay | Admitting: Family

## 2015-01-12 ENCOUNTER — Ambulatory Visit (INDEPENDENT_AMBULATORY_CARE_PROVIDER_SITE_OTHER): Payer: Managed Care, Other (non HMO) | Admitting: Family

## 2015-01-12 VITALS — BP 114/72 | HR 59 | Temp 98.4°F | Resp 16 | Ht 75.0 in | Wt 235.8 lb

## 2015-01-12 DIAGNOSIS — H6691 Otitis media, unspecified, right ear: Secondary | ICD-10-CM | POA: Diagnosis not present

## 2015-01-12 DIAGNOSIS — F419 Anxiety disorder, unspecified: Principal | ICD-10-CM

## 2015-01-12 DIAGNOSIS — J020 Streptococcal pharyngitis: Secondary | ICD-10-CM

## 2015-01-12 DIAGNOSIS — J029 Acute pharyngitis, unspecified: Secondary | ICD-10-CM

## 2015-01-12 DIAGNOSIS — F418 Other specified anxiety disorders: Secondary | ICD-10-CM | POA: Diagnosis not present

## 2015-01-12 DIAGNOSIS — F329 Major depressive disorder, single episode, unspecified: Secondary | ICD-10-CM

## 2015-01-12 LAB — POCT RAPID STREP A (OFFICE): RAPID STREP A SCREEN: POSITIVE — AB

## 2015-01-12 MED ORDER — VENLAFAXINE HCL ER 150 MG PO CP24: 150.0000 mg | ORAL_CAPSULE | Freq: Every day | ORAL | Status: DC

## 2015-01-12 MED ORDER — CEFDINIR 300 MG PO CAPS: 300.0000 mg | ORAL_CAPSULE | Freq: Two times a day (BID) | ORAL | Status: DC

## 2015-01-12 MED ORDER — ALPRAZOLAM 0.5 MG PO TABS: 0.5000 mg | ORAL_TABLET | Freq: Two times a day (BID) | ORAL | Status: DC | PRN

## 2015-01-12 NOTE — Assessment & Plan Note (Addendum)
Deteriorated.  Increase effexor xr to 150mg  daily. Advised pt OK to use xanax bid prn. We did discuss if he develops suicidal thoughts with plan he is to call 911.

## 2015-01-12 NOTE — Patient Instructions (Signed)
Start cefdinir (antibiotic) for your ear infection. Increase effexor xr to 150mg  once daily. Call if depression/anxiety symptoms worsen or do not improve. You may use xanax twice daily as needed for panic attacks or insomnia. Call 911 or go to the ER if you develop thoughts of hurting yourself.

## 2015-01-12 NOTE — Progress Notes (Signed)
Pre visit review using our clinic review tool, if applicable. No additional management support is needed unless otherwise documented below in the visit note. 

## 2015-01-12 NOTE — Progress Notes (Signed)
Subjective:    Patient ID: Jimmy Hardin, male    DOB: 1976-01-01, 39 y.o.   MRN: [id number]  HPI   Jimmy Hardin is a 39 yr old male who presents today to discuss anxiety and depression. 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  Currently maintained on effexor.  Feels apathetic. Lots of work stress.  Hard to get out of bed.  Notes episodes of panic.  Notes feelings of hopelessness and wondering "why do I do this."  No formal suicidal thoughts with plan however.     Wt Readings from Last 3 Encounters:  01/12/15 235 lb 12.8 oz (106.958 kg)  12/14/14 236 lb 12.8 oz (107.412 kg)  09/06/14 215 lb 6.4 oz (97.705 kg)   Reports several day hx of right sided facial pain, sore throat, right ear pain.  Review of Systems See HPI  Past Medical History  Diagnosis Date  . Seasonal allergies     Social History   Social History  . Marital Status: Married    Spouse Name: N/A  . Number of Children: N/A  . Years of Education: N/A   Occupational History  . Not on file.   Social History Main Topics  . Smoking status: Former Games developermoker  . Smokeless tobacco: Never Used  . Alcohol Use: Yes     Comment: occasional glass of wine  . Drug Use: Not on file  . Sexual Activity: Not on file   Other Topics Concern  . 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 Esophagogastroduodenoscopy endoscopy  2014    told inflammatory bowel and not to eat gluten    Family History  Problem Relation Age of Onset  . Adopted: Yes    Allergies  Allergen Reactions  . Amoxicillin-Pot Clavulanate   . Prozac [Fluoxetine Hcl] Other (See Comments)    Mood swing, angry, increased depression    Current Outpatient Prescriptions on File Prior to  Visit  Medication Sig Dispense Refill  . acetaminophen (TYLENOL) 500 MG tablet Take 500 mg by mouth every 6 (six) hours as needed.    . sildenafil (VIAGRA) 50 MG tablet Take 1 tablet (50 mg total) by mouth daily as needed for erectile dysfunction. (Patient not taking: Reported on 01/12/2015) 10 tablet 2   No current facility-administered medications on file prior to visit.    BP 114/72 mmHg  Pulse 59  Temp(Src) 98.4 F (36.9 C) (Oral)  Resp 16  Ht 6\' 3"  (1.905 m)  Wt 235 lb 12.8 oz (106.958 kg)  BMI 29.47 kg/m2  SpO2 100%       Objective:   Physical Exam  Constitutional: He is oriented to person, place, and time. He appears well-developed and well-nourished. No distress.  HENT:  Head: Normocephalic and atraumatic.  Right Ear: Tympanic membrane is erythematous and retracted. Tympanic membrane is not bulging.  Left Ear: Tympanic membrane and ear canal normal.  Mouth/Throat: Posterior oropharyngeal erythema present. No oropharyngeal exudate.  Cardiovascular: Normal rate and regular rhythm.   No murmur heard. Pulmonary/Chest: Effort normal and breath sounds normal. No respiratory distress. He has no wheezes. He has no rales.  Musculoskeletal: He  exhibits no edema.  Neurological: He is alert and oriented to person, place, and time.  Skin: Skin is warm and dry.  Psychiatric: He has a normal mood and affect. His behavior is normal. Thought content normal.          Assessment & Plan:  Otitis Media- Right- hx of rash on augmentin. Will rx with cefdinir.    Strep Throat- rapid strep + (rx cefdinir)

## 2015-02-09 ENCOUNTER — Ambulatory Visit (INDEPENDENT_AMBULATORY_CARE_PROVIDER_SITE_OTHER): Payer: Managed Care, Other (non HMO) | Admitting: Family

## 2015-02-09 ENCOUNTER — Encounter: Payer: Self-pay | Admitting: Family

## 2015-02-09 VITALS — BP 107/73 | HR 64 | Temp 98.4°F | Resp 16 | Ht 75.0 in | Wt 242.0 lb

## 2015-02-09 DIAGNOSIS — J40 Bronchitis, not specified as acute or chronic: Secondary | ICD-10-CM

## 2015-02-09 DIAGNOSIS — F418 Other specified anxiety disorders: Secondary | ICD-10-CM

## 2015-02-09 DIAGNOSIS — F419 Anxiety disorder, unspecified: Principal | ICD-10-CM

## 2015-02-09 DIAGNOSIS — J209 Acute bronchitis, unspecified: Secondary | ICD-10-CM

## 2015-02-09 DIAGNOSIS — F329 Major depressive disorder, single episode, unspecified: Secondary | ICD-10-CM

## 2015-02-09 MED ORDER — VENLAFAXINE HCL ER 150 MG PO CP24: 150.0000 mg | ORAL_CAPSULE | Freq: Every day | ORAL | Status: DC

## 2015-02-09 MED ORDER — ALPRAZOLAM 0.5 MG PO TABS: 0.5000 mg | ORAL_TABLET | Freq: Two times a day (BID) | ORAL | Status: DC | PRN

## 2015-02-09 MED ORDER — ALBUTEROL SULFATE HFA 108 (90 BASE) MCG/ACT IN AERS: 2.0000 | INHALATION_SPRAY | Freq: Four times a day (QID) | RESPIRATORY_TRACT | Status: DC | PRN

## 2015-02-09 MED FILL — PROAIR HFA 90 MCG INHALER: 108 (90 BAS | 30 days supply | Qty: 9 | Fill #0

## 2015-02-09 MED FILL — ALPRAZolam 0.5 MG TABS: 0.5 | 30 days supply | Qty: 60 | Fill #0

## 2015-02-09 MED FILL — VENLAFAXINE HCL ER 150 MG C: 150 | 30 days supply | Qty: 30 | Fill #0 | Status: TO

## 2015-02-09 NOTE — Assessment & Plan Note (Signed)
Improved on current dose of effexor. Continue effexor + prn xanax.

## 2015-02-09 NOTE — Patient Instructions (Addendum)
Continue currend dose of effexor.  Add albuterol as needed for viral bronchitis. Call if symptoms worsen, if fever >101 or if not improved in 3-4 days. Continue mucinex prn.

## 2015-02-09 NOTE — Progress Notes (Signed)
Pre visit review using our clinic review tool, if applicable. No additional management support is needed unless otherwise documented below in the visit note. 

## 2015-02-09 NOTE — Progress Notes (Signed)
Subjective:    Patient ID: Jimmy Hardin, male    DOB: 11-Aug-1975, 40 y.o.   MRN: [id number]  HPI  Jimmy Hardin is a 40 yr old male who presents today for follow up.  1) Anxiety/depression-  Last visit effexor was increased.  Feels more engaged. Using xanax 1-2 times a day. Wt Readings from Last 3 Encounters:  02/09/15 242 lb (109.77 kg)  01/12/15 235 lb 12.8 oz (106.958 kg)  12/14/14 236 lb 12.8 oz (107.412 kg)   Lab Results  Component Value Date   TSH 2.25 09/06/2014   2) "chest cold"-  + cough began Monday AM.  Using mucinex for 3 days. Left work early on Tuesday due to feeling badly.  Cough is productive and intermittent. This AM he reports that he had trouble getting a deep breath.  Improved after he was up a bit. Had a mild sore throat this AM.  He denies known fever.     Review of Systems See HPI  Past Medical History  Diagnosis Date  . Seasonal allergies     Social History   Social History  . Marital Status: Married    Spouse Name: N/A  . Number of Children: N/A  . Years of Education: N/A   Occupational History  . Not on file.   Social History Main Topics  . Smoking status: Former Games developer  . Smokeless tobacco: Never Used  . Alcohol Use: Yes     Comment: occasional glass of wine  . Drug Use: Not on file  . Sexual Activity: Not on file   Other Topics Concern  . 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 Esophagogastroduodenoscopy endoscopy  2014    told inflammatory bowel and not to eat gluten    Family History  Problem Relation Age of Onset  . Adopted: Yes    Allergies  Allergen Reactions  . Amoxicillin-Pot Clavulanate   . Prozac [Fluoxetine Hcl] Other (See Comments)    Mood swing, angry, increased depression    Current Outpatient Prescriptions on File  Prior to Visit  Medication Sig Dispense Refill  . acetaminophen (TYLENOL) 500 MG tablet Take 500 mg by mouth every 6 (six) hours as needed.    . ALPRAZolam (XANAX) 0.5 MG tablet Take 1 tablet (0.5 mg total) by mouth 2 (two) times daily as needed for anxiety. 60 tablet 0  . cefdinir (OMNICEF) 300 MG capsule Take 1 capsule (300 mg total) by mouth 2 (two) times daily. 20 capsule 0  . sildenafil (VIAGRA) 50 MG tablet Take 1 tablet (50 mg total) by mouth daily as needed for erectile dysfunction. 10 tablet 2  . venlafaxine XR (EFFEXOR XR) 150 MG 24 hr capsule Take 1 capsule (150 mg total) by mouth daily with breakfast. 30 capsule 1   No current facility-administered medications on file prior to visit.    BP 107/73 mmHg  Pulse 64  Temp(Src) 98.4 F (36.9 C) (Oral)  Resp 16  Ht 6\' 3"  (1.905 m)  Wt 242 lb (109.77 kg)  BMI 30.25 kg/m2  SpO2 100%       Objective:   Physical Exam  Constitutional: He is oriented to person, place, and time. He appears well-developed and well-nourished. No distress.  HENT:  Head: Normocephalic and atraumatic.  Right Ear: Tympanic membrane and ear canal normal.  Left Ear: Tympanic membrane and ear canal normal.  Mouth/Throat: No oropharyngeal exudate, posterior oropharyngeal edema or posterior oropharyngeal erythema.  Eyes: Conjunctivae are normal. No scleral icterus.  Neck: Neck supple.  Cardiovascular: Normal rate and regular rhythm.   No murmur heard. Pulmonary/Chest: Effort normal and breath sounds normal. No respiratory distress.  Mild wheeze/rhonchi, clears with cough  Musculoskeletal: He exhibits no edema.  Lymphadenopathy:    He has no cervical adenopathy.  Neurological: He is alert and oriented to person, place, and time.  Skin: Skin is warm and dry.  Psychiatric: He has a normal mood and affect. His behavior is normal. Thought content normal.          Assessment & Plan:  Bronchitis with mild bronchospasm- suspect viral etiology. Advised  supportive measures, add albuterol prn.  Call if symptoms worsen, if fever >101 or if not improved in 3-4 days. Continue mucinex prn.

## 2015-03-14 ENCOUNTER — Other Ambulatory Visit: Payer: Self-pay | Admitting: Family

## 2015-03-14 NOTE — Telephone Encounter (Signed)
Name from pharmacy:  In chart as:  ALPRAZOLAM 0.25MG  TABLETS ALPRAZolam (XANAX) 0.25 MG tablet    Sig: TAKE 1 TABLET BY MOUTH TWICE DAILY AS NEEDED FOR ANXIETY OR SLEEP    Dispense: 30 tablet   Refills: 0   Start: 03/14/2015   Class: Normal    Requested on: 03/14/2015    11/06/2014       Pt has f/u on 05/11/15. No previous controlled substance contract or UDS on file. Pt will need to complete both at next OV. Rx printed and forwarded to PCP for signature. Rx faxed to pharmacy.

## 2015-03-16 ENCOUNTER — Ambulatory Visit: Payer: Managed Care, Other (non HMO) | Admitting: Family

## 2015-04-20 ENCOUNTER — Other Ambulatory Visit: Payer: Self-pay | Admitting: Family

## 2015-04-24 NOTE — Telephone Encounter (Signed)
Last OV 02/09/15 Alprazolam last filled 03/14/15 #30 with 0

## 2015-04-24 NOTE — Telephone Encounter (Signed)
Ok to send refill  

## 2015-04-25 NOTE — Telephone Encounter (Signed)
Rx called to pharmacy voicemail. 

## 2015-05-11 ENCOUNTER — Telehealth: Payer: Self-pay | Admitting: Family

## 2015-05-11 ENCOUNTER — Ambulatory Visit: Payer: Managed Care, Other (non HMO) | Admitting: Family

## 2015-05-11 DIAGNOSIS — Z0289 Encounter for other administrative examinations: Secondary | ICD-10-CM

## 2015-05-14 NOTE — Telephone Encounter (Signed)
Pt was no show 05/11/15 9:30am for f/u appt, pt has not rescheduled, 2 no shows and 2 cancellations w/in 12 months, charge or no charge?

## 2015-05-16 ENCOUNTER — Encounter: Payer: Self-pay | Admitting: Family

## 2015-05-16 NOTE — Telephone Encounter (Signed)
Marked to charge and mailing no show letter °

## 2015-05-16 NOTE — Telephone Encounter (Signed)
Yes please

## 2015-05-23 ENCOUNTER — Other Ambulatory Visit: Payer: Self-pay | Admitting: Family

## 2015-05-23 ENCOUNTER — Telehealth: Payer: Self-pay | Admitting: Family

## 2015-05-23 MED ORDER — ALPRAZOLAM 0.25 MG PO TABS: ORAL_TABLET | ORAL | Status: DC

## 2015-05-23 NOTE — Telephone Encounter (Signed)
Relation to ZO:XWRUpt:self Call back number:[phone] [facility]: [facility] [id number] - [place], [place] - [address] [facility] [address] [facility] [address]  Reason for call:  Patient requesting a refill ALPRAZolam (XANAX) 0.25 MG tablet

## 2015-05-23 NOTE — Telephone Encounter (Signed)
Rx faxed to pharmacy. Tried to notify pt but voice mailbox was full.

## 2015-05-23 NOTE — Telephone Encounter (Signed)
Last Alprazolam Rx 04/25/15, #30. No previous CSC or UDS. Pt was due for follow up with Melissa on 05/11/15 and no showed. Rx printed and forwarded to PCP for signature. Pt will need OV for future refills.

## 2015-05-23 NOTE — Telephone Encounter (Signed)
[name]-- please call pt and advise him Jimmy KaufmannMelissa wants him to reschedule his follow up that was originally on 05/11/15. She will need to see him before further refills can be given. Thanks!

## 2015-05-25 NOTE — Telephone Encounter (Signed)
Called pt. No answer. Pt doesn't have a voicemail set up. Unable to leave a voicemail.

## 2015-07-04 ENCOUNTER — Other Ambulatory Visit: Payer: Self-pay | Admitting: Family

## 2015-07-04 NOTE — Telephone Encounter (Signed)
Last Alprazolam Rx: 05/23/15, #30 Last UDS: No previous uds or CSC on file. We were going to have pt complete at f/u on 05/11/15 but pt cancelled and has not rescheduled.  Please advise refill?

## 2015-07-09 ENCOUNTER — Other Ambulatory Visit: Payer: Self-pay | Admitting: Family

## 2015-07-09 NOTE — Telephone Encounter (Signed)
Needs OV prior to additional refills.  

## 2015-07-10 NOTE — Telephone Encounter (Signed)
Attempted to reach pt and notify him of need for office visit before further refills can be given and received message that voice mailbox was full. Denial sent to pharmacy.

## 2015-11-26 ENCOUNTER — Telehealth: Payer: Self-pay | Admitting: Family

## 2015-11-26 ENCOUNTER — Ambulatory Visit: Payer: Self-pay | Admitting: Medical

## 2015-11-26 NOTE — Telephone Encounter (Signed)
Patient Name: Jimmy CollinMOTHY Dengel DOB: 12-14-1975 Initial Comment Caller states having blood in stool, been tired, he is at work, he says if he doesn't answer to leave message Nurse Assessment Nurse: Yetta BarreJones, RN, Miranda Date/Time (Eastern Time): 11/26/2015 12:31:12 PM Confirm and document reason for call. If symptomatic, describe symptoms. You must click the next button to save text entered. ---Caller states he noticed blood in the toilet after having a BM today. He has had fatigue and stomach cramps. Has the patient traveled out of the country within the last 30 days? ---Not Applicable Does the patient have any new or worsening symptoms? ---Yes Will a triage be completed? ---Yes Related visit to physician within the last 2 weeks? ---No Does the PT have any chronic conditions? (i.e. diabetes, asthma, etc.) ---No Is this a behavioral health or substance abuse call? ---No Guidelines Guideline Title Affirmed Question Affirmed Notes Rectal Bleeding MILD rectal bleeding (more than just a few drops or streaks) Final Disposition User See PCP When Office is Open (within 3 days) Yetta BarreJones, RN, Miranda Comments Appt scheduled with PCP on Wednesday at 2pm Referrals REFERRED TO PCP OFFICE Disagree/Comply: Comply

## 2015-11-26 NOTE — Telephone Encounter (Signed)
See additional phone note from 11/26/15.

## 2015-11-26 NOTE — Telephone Encounter (Signed)
Left message requesting call back. Advised pt to call us back. I would like to know how long he has been fatigued and how many blood stools. If more than 1 bloody stool, and if + abdominal pain, then I would recommend that he go to the ER.

## 2015-11-26 NOTE — Telephone Encounter (Signed)
Please see below and advise.

## 2015-11-26 NOTE — Telephone Encounter (Signed)
Patient called stating that he has been feeling fatigued and this morning saw blood in his stool. Transferred to [facility], spoke with Lawson FiscalLori

## 2015-11-27 ENCOUNTER — Ambulatory Visit (INDEPENDENT_AMBULATORY_CARE_PROVIDER_SITE_OTHER): Payer: BLUE CROSS/BLUE SHIELD | Admitting: Family

## 2015-11-27 ENCOUNTER — Encounter: Payer: Self-pay | Admitting: Gastroenterology

## 2015-11-27 ENCOUNTER — Encounter: Payer: Self-pay | Admitting: Family

## 2015-11-27 ENCOUNTER — Encounter (HOSPITAL_BASED_OUTPATIENT_CLINIC_OR_DEPARTMENT_OTHER): Payer: Self-pay

## 2015-11-27 ENCOUNTER — Ambulatory Visit (HOSPITAL_BASED_OUTPATIENT_CLINIC_OR_DEPARTMENT_OTHER)
Admission: RE | Admit: 2015-11-27 | Discharge: 2015-11-27 | Disposition: A | Discharge status: Home / Self Care | Payer: BLUE CROSS/BLUE SHIELD | Source: Ambulatory Visit | Attending: Family | Admitting: Family

## 2015-11-27 ENCOUNTER — Telehealth: Payer: Self-pay | Admitting: Family

## 2015-11-27 VITALS — BP 130/80 | HR 78 | Temp 98.0°F | Resp 16 | Ht 75.0 in | Wt 268.2 lb

## 2015-11-27 DIAGNOSIS — R635 Abnormal weight gain: Secondary | ICD-10-CM

## 2015-11-27 DIAGNOSIS — K625 Hemorrhage of anus and rectum: Secondary | ICD-10-CM

## 2015-11-27 DIAGNOSIS — R1031 Right lower quadrant pain: Secondary | ICD-10-CM

## 2015-11-27 DIAGNOSIS — G8929 Other chronic pain: Secondary | ICD-10-CM | POA: Insufficient documentation

## 2015-11-27 DIAGNOSIS — R109 Unspecified abdominal pain: Secondary | ICD-10-CM | POA: Diagnosis not present

## 2015-11-27 LAB — COMPREHENSIVE METABOLIC PANEL
ALBUMIN: 4.7 g/dL (ref 3.5–5.2)
ALT: 17 U/L (ref 0–53)
AST: 24 U/L (ref 0–37)
Alkaline Phosphatase: 58 U/L (ref 39–117)
BUN: 16 mg/dL (ref 6–23)
CO2: 32 mEq/L (ref 19–32)
Calcium: 9.5 mg/dL (ref 8.4–10.5)
Chloride: 102 mEq/L (ref 96–112)
Creatinine, Ser: 0.91 mg/dL (ref 0.40–1.50)
GFR: 98.46 mL/min (ref >60.00)
GLUCOSE: 109 mg/dL — AB (ref 70–99)
POTASSIUM: 4.3 meq/L (ref 3.5–5.1)
Sodium: 139 mEq/L (ref 135–145)
Total Bilirubin: 0.8 mg/dL (ref 0.2–1.2)
Total Protein: 7.1 g/dL (ref 6.0–8.3)

## 2015-11-27 LAB — CBC WITH DIFFERENTIAL/PLATELET
Basophils Absolute: 0 10*3/uL (ref 0.0–0.1)
Basophils Relative: 0.4 % (ref 0.0–3.0)
EOS PCT: 1.2 % (ref 0.0–5.0)
Eosinophils Absolute: 0.1 10*3/uL (ref 0.0–0.7)
HCT: 44.3 % (ref 39.0–52.0)
Hemoglobin: 15.3 g/dL (ref 13.0–17.0)
LYMPHS ABS: 1.3 10*3/uL (ref 0.7–4.0)
Lymphocytes Relative: 14.1 % (ref 12.0–46.0)
MCHC: 34.5 g/dL (ref 30.0–36.0)
MCV: 89.7 fl (ref 78.0–100.0)
MONOS PCT: 5.8 % (ref 3.0–12.0)
Monocytes Absolute: 0.6 10*3/uL (ref 0.1–1.0)
NEUTROS PCT: 78.5 % — AB (ref 43.0–77.0)
Neutro Abs: 7.5 10*3/uL (ref 1.4–7.7)
Platelets: 320 10*3/uL (ref 150.0–400.0)
RBC: 4.94 Mil/uL (ref 4.22–5.81)
RDW: 13.1 % (ref 11.5–15.5)
WBC: 9.6 10*3/uL (ref 4.0–10.5)

## 2015-11-27 LAB — TSH: TSH: 1.65 u[IU]/mL (ref 0.35–4.50)

## 2015-11-27 MED ORDER — IOPAMIDOL (ISOVUE-300) INJECTION 61%
100.0000 mL | Freq: Once | INTRAVENOUS | Status: AC | PRN
  Administered 2015-11-27: 100 mL via INTRAVENOUS

## 2015-11-27 NOTE — Telephone Encounter (Signed)
Spoke to patient, reviewed CT results. He is agreeable to GI referral. Denies GU/urinary complaints. He is advised to go to the ER if he develops recurrent BRBPR (more than a small streak on tissue).

## 2015-11-27 NOTE — Progress Notes (Signed)
Pre visit review using our clinic review tool, if applicable. No additional management support is needed unless otherwise documented below in the visit note. 

## 2015-11-27 NOTE — Telephone Encounter (Signed)
Spoke with pt. He states he is unsure how many bloody stools he has had. Also could not determine specific amount of time that fatigue has been present as he states his job is very tiring. Denies abdominal pain but does have tenderness to right lower side. Advised pt that PCP doesn't want him to wait until tomorrow for evaluation. He states he is not able to come in today. Advised pt he should be evaluated in the ER. Pt refuses then says he will come in to the office today. Appt scheduled for 2pm with Melissa. Advised pt that if symptoms worsen he should go to the ER for evaluation and he voices understanding.

## 2015-11-27 NOTE — Progress Notes (Signed)
Subjective:    Patient ID: Jimmy Hardin, male    DOB: 11-Jul-1975, 40 y.o.   MRN: [id number]  HPI  Jimmy Hardin is a 40 yr old male who presents today with c/o blood in stool.  Reports that he has hx of hemorrhoids.  Reports that he had some rectal burning/discomfort.  Reports that his stools have been soft.  Last week he thought he saw "red specks."  Yesterday AM the toilet water was red/pink colored and the stool appeared to have red streaks. + red blood on tissue.  He was without insurance for several months.  He reports that his abdomen has been tender for a few weeks. Especially in the right lower abdomen. Notes overall fatigue/run down.  He works 14.5 hours a day 5 days a week.    Reports + weight gain. Diet is not changing.  He is no longer exercising as much because of his working more.     Review of Systems See HPI  Past Medical History:  Diagnosis Date  . Seasonal allergies      Social History   Social History  . Marital status: Married    Spouse name: N/A  . Number of children: N/A  . Years of education: N/A   Occupational History  . Not on file.   Social History Main Topics  . Smoking status: Former Games developer  . Smokeless tobacco: Never Used  . Alcohol use Yes     Comment: occasional glass of wine  . Drug use: Unknown  . Sexual activity: Not on file   Other Topics Concern  . 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 ESOPHAGOGASTRODUODENOSCOPY ENDOSCOPY  2014   told inflammatory bowel and not to eat gluten    Family History  Problem Relation Age of Onset  . Adopted: Yes    Allergies  Allergen Reactions  . Amoxicillin-Pot Clavulanate   . Prozac [Fluoxetine Hcl] Other (See Comments)    Mood swing, angry, increased depression  . Venlafaxine Other (See Comments)   Felt "emtionless"    No current outpatient prescriptions on file prior to visit.   No current facility-administered medications on file prior to visit.     BP 130/80 (BP Location: Right Arm, Cuff Size: Large)   Pulse 78   Temp 98 F (36.7 C) (Oral)   Resp 16   Ht 6\' 3"  (1.905 m)   Wt 268 lb 3.2 oz (121.7 kg)   SpO2 98% Comment: room air  BMI 33.52 kg/m       Objective:   Physical Exam  Constitutional: He is oriented to person, place, and time. He appears well-developed and well-nourished. No distress.  HENT:  Head: Normocephalic and atraumatic.  Cardiovascular: Normal rate and regular rhythm.   No murmur heard. Pulmonary/Chest: Effort normal and breath sounds normal. No respiratory distress. He has no wheezes. He has no rales.  Abdominal: Soft. Bowel sounds are decreased. There is tenderness in the right lower quadrant. There is guarding.  Mild tenderness throughout abdomen, but worst in the right lower quadrant  Genitourinary: Rectum normal. Rectal exam shows guaiac negative stool.  Musculoskeletal: He exhibits no edema.  Neurological: He is alert and oriented to person, place, and time.  Skin: Skin  is warm and dry.  Psychiatric: He has a normal mood and affect. His behavior is normal. Thought content normal.          Assessment & Plan:  Abdominal pain- concerning for appendicitis. Will obtain stat CT abd/pelvis for further evaluation.  Rectal bleeding- suspect hemorrhoidal bleed. Will obtain cbc and refer to GI for further evaluation.   Weight gain- obtain TSH.

## 2015-11-27 NOTE — Patient Instructions (Signed)
Please complete lab work prior to leaving. °Complete CT scan on the first floor.  °

## 2015-11-28 ENCOUNTER — Ambulatory Visit: Payer: Self-pay | Admitting: Family

## 2016-01-23 ENCOUNTER — Ambulatory Visit: Payer: BLUE CROSS/BLUE SHIELD | Admitting: Gastroenterology

## 2016-04-02 ENCOUNTER — Ambulatory Visit: Payer: BLUE CROSS/BLUE SHIELD | Admitting: Gastroenterology

## 2016-10-21 ENCOUNTER — Telehealth: Payer: Self-pay | Admitting: Family

## 2016-10-21 ENCOUNTER — Encounter: Payer: Self-pay | Admitting: *Deleted

## 2016-10-21 ENCOUNTER — Emergency Department (INDEPENDENT_AMBULATORY_CARE_PROVIDER_SITE_OTHER)
Admission: EM | Admit: 2016-10-21 | Discharge: 2016-10-21 | Disposition: A | Discharge status: Home / Self Care | Payer: BLUE CROSS/BLUE SHIELD | Source: Home / Self Care | Attending: Family Medicine | Admitting: Family Medicine

## 2016-10-21 DIAGNOSIS — H5509 Other forms of nystagmus: Secondary | ICD-10-CM

## 2016-10-21 DIAGNOSIS — R42 Dizziness and giddiness: Secondary | ICD-10-CM

## 2016-10-21 MED ORDER — MECLIZINE HCL 25 MG PO TABS: 25.0000 mg | ORAL_TABLET | Freq: Three times a day (TID) | ORAL | 0 refills | Status: DC | PRN

## 2016-10-21 NOTE — Discharge Instructions (Signed)
°  Antivert (meclizine) is a medication to help with dizziness and nausea related to vertigo.  This medication can cause drowsiness. Do not operate heavy machinery or drive while taking.  ° °

## 2016-10-21 NOTE — ED Provider Notes (Signed)
[facility]    CSN: [id number] Arrival date & time: 10/21/16  1255     History   Chief Complaint Chief Complaint  Patient presents with  . Dizziness  . Headache  . Fatigue    HPI Jimmy Hardin is a 41 y.o. male.   HPI Drayton Tieu is a 41 y.o. male presenting to UC with c/o intermittent dizziness with mild posterior head, shoulder, and facial pressure, mild nausea, and feeling flush while walking at times.  Dizziness worse with sudden movements.  Symptoms started yesterday while he was golfing. He initially thought it was the heat to he took a break, ate some chicken wings, had 2 beers, then went to run errands.  He notes he almost fell when he got out of his car due to the dizziness but states that gradually resolved.  Last night he woke in his sleep 2 or 3 times with his heart racing but he thinks that could have been due to him waking himself from snoring.  He has been more fatigued since yesterday. He has been drinking water and normal amount of food. Denies increased stress or decreased sleep. No new medications. He has been taking melatonin 1-2 tabs at night but has been taking it for several months and has not had any problems with taking it previously.  He has been on anxiety medications in the past but has not been on them for at least a year. He does not feel like these symptoms are similar to his anxiety symptoms. Denies hx of HTN or heart problems.    Past Medical History:  Diagnosis Date  . Seasonal allergies     Patient Active Problem List   Diagnosis Date Noted  . Erectile dysfunction 09/06/2014  . GERD (gastroesophageal reflux disease) 09/06/2014  . Finger mass, right 06/07/2013  . Seborrheic dermatitis of scalp 06/07/2013  . Routine general medical examination at a health care facility 03/14/2013  . Anxiety and depression 02/23/2013  . ALLERGIC RHINITIS 09/24/2009    Past Surgical History:  Procedure Laterality Date  .  ESOPHAGOGASTRODUODENOSCOPY ENDOSCOPY  2014   told inflammatory bowel and not to eat gluten       Home Medications    Prior to Admission medications   Medication Sig Start Date End Date Taking? Authorizing Provider  Melatonin 10 MG TABS Take by mouth.   Yes [provider]  meclizine (ANTIVERT) 25 MG tablet Take 1 tablet (25 mg total) by mouth 3 (three) times daily as needed for dizziness. 10/21/16   Lurene Shadow, PA-C    Family History Family History  Problem Relation Age of Onset  . Adopted: Yes    Social History Social History  Substance Use Topics  . Smoking status: Former Games developer  . Smokeless tobacco: Never Used  . Alcohol use Yes     Comment: occasional glass of wine     Allergies   Prozac [fluoxetine hcl]; Venlafaxine; and Amoxicillin-pot clavulanate   Review of Systems Review of Systems  Constitutional: Negative for chills and fever.  HENT: Negative for congestion, ear pain, sore throat, trouble swallowing and voice change.   Eyes: Negative for photophobia and visual disturbance.  Respiratory: Negative for cough and shortness of breath.   Cardiovascular: Negative for chest pain and palpitations.  Gastrointestinal: Positive for nausea. Negative for abdominal pain, diarrhea and vomiting.  Musculoskeletal: Negative for arthralgias, back pain and myalgias.  Skin: Negative for rash.  Neurological: Positive for dizziness, light-headedness and headaches. Negative for tremors,  seizures, syncope, facial asymmetry, speech difficulty, weakness and numbness.     Physical Exam Triage Vital Signs ED Triage Vitals  Enc Vitals Group     BP 10/21/16 1322 126/80     Pulse Rate 10/21/16 1322 77     Resp 10/21/16 1322 14     Temp 10/21/16 1322 98.3 F (36.8 C)     Temp Source 10/21/16 1322 Oral     SpO2 10/21/16 1322 99 %     Weight 10/21/16 1323 259 lb (117.5 kg)     Height --      Head Circumference --      Peak Flow --      Pain Score 10/21/16 1323 4      Pain Loc --      Pain Edu? --      Excl. in GC? --    Orthostatic VS for the past 24 hrs:  BP- Lying Pulse- Lying BP- Sitting Pulse- Sitting BP- Standing at 0 minutes Pulse- Standing at 0 minutes  10/21/16 1352 121/82 68 126/85 67 120/87 71    Updated Vital Signs BP 126/80 (BP Location: Left Arm)   Pulse 77   Temp 98.3 F (36.8 C) (Oral)   Resp 14   Wt 259 lb (117.5 kg)   SpO2 99%   BMI 32.37 kg/m   Visual Acuity Right Eye Distance:   Left Eye Distance:   Bilateral Distance:    Right Eye Near:   Left Eye Near:    Bilateral Near:     Physical Exam  Constitutional: He is oriented to person, place, and time. He appears well-developed and well-nourished. No distress.  HENT:  Head: Normocephalic and atraumatic.  Right Ear: Tympanic membrane normal.  Left Ear: Tympanic membrane normal.  Nose: Nose normal.  Mouth/Throat: Uvula is midline, oropharynx is clear and moist and mucous membranes are normal.  Eyes: Pupils are equal, round, and reactive to light. Conjunctivae are normal. Right eye exhibits nystagmus. Left eye exhibits nystagmus.  Bilateral horizontal nystagmus, worse towards the Right  Neck: Normal range of motion. Neck supple.  No midline bone tenderness, no crepitus or step-offs. No nuchal rigidity or meningeal signs.  Cardiovascular: Normal rate and regular rhythm.   Pulmonary/Chest: Effort normal and breath sounds normal. No stridor. No respiratory distress. He has no wheezes. He has no rales.  Musculoskeletal: Normal range of motion. He exhibits no edema or tenderness.  Full ROM upper and lower extremities bilaterally with 5/5 strength  Lymphadenopathy:    He has no cervical adenopathy.  Neurological: He is alert and oriented to person, place, and time.  Face is symmetric. Speech is clear. Alert to person, place and time. Normal finger to nose coordination. Normal gait and normal heel-to-toe walk.  Skin: Skin is warm and dry. He is not diaphoretic.    Psychiatric: He has a normal mood and affect. His behavior is normal.  Nursing note and vitals reviewed.    UC Treatments / Results  Labs (all labs ordered are listed, but only abnormal results are displayed) Labs Reviewed - No data to display  EKG  EKG Interpretation None       Radiology No results found.  Procedures Procedures (including critical care time)  Medications Ordered in UC Medications - No data to display   Initial Impression / Assessment and Plan / UC Course  I have reviewed the triage vital signs and the nursing notes.  Pertinent labs & imaging results that were available during my care  of the patient were reviewed by me and considered in my medical decision making (see chart for details).     Hx and exam c/w vertigo.  Mild horizontal nystagmus but otherwise normal neuro exam. Normal orthostatic vitals Doubt CVS, SAH, meningitis, or other emergent process taking place at this time. Home care instructions provided Encouraged f/u with PCP in 2-3 days if not improving. Discussed symptoms that warrant emergent care in the ED.   Final Clinical Impressions(s) / UC Diagnoses   Final diagnoses:  Vertigo  Horizontal nystagmus    New Prescriptions Discharge Medication List as of 10/21/2016  1:44 PM    START taking these medications   Details  meclizine (ANTIVERT) 25 MG tablet Take 1 tablet (25 mg total) by mouth 3 (three) times daily as needed for dizziness., Starting Tue 10/21/2016, Print         Controlled Substance Prescriptions [place] Controlled Substance Registry consulted? Not Applicable   Rolla Plate 10/21/16 1501

## 2016-10-21 NOTE — Medical Student Note (Cosign Needed)
[facility] Provider Student Note For educational purposes for Medical, PA and NP students only and not part of the legal medical record.   CSN: [id number] Arrival date & time: 10/21/16  1255     History   Chief Complaint Chief Complaint  Patient presents with  . Dizziness  . Headache  . Fatigue    HPI   Jimmy Hardin is a 41 y.o. male who presents today with complaints of dizziness that started yesterday. He states that the symptoms are worsened by level changes and are accompanied by nausea, but no vomiting. He reports staying well hydrated. He golfs three times a week and played yesterday. He's concerned that he's more tired and sore than usual. He has not tried anything for his dizziness. He takes 10 mg of melatonin nightly to help fall asleep. He has a history of anxiety, but is not currently taking any medication for it. He reports waking up with palpitations during the night, but is not certain if that is related to the symptoms he is currently experiencing. He is worried he is experiencing a stroke.   The history is provided by the patient.  Dizziness  Quality:  Head spinning Severity:  Moderate Onset quality:  Sudden Duration:  2 days Timing:  Intermittent Progression:  Unchanged Chronicity:  New Context: bending over, head movement and standing up   Context: not with ear pain   Relieved by:  Nothing Worsened by:  Standing up and turning head Ineffective treatments:  Fluids and food Associated symptoms: headaches, nausea and palpitations   Associated symptoms: no chest pain and no shortness of breath   Risk factors: no hx of stroke, no multiple medications and no new medications   Headache  Associated symptoms: dizziness, fatigue, nausea, neck pain and neck stiffness   Associated symptoms: no congestion and no ear pain     Past Medical History:  Diagnosis Date  . Seasonal allergies     Patient Active Problem List   Diagnosis Date Noted    . Erectile dysfunction 09/06/2014  . GERD (gastroesophageal reflux disease) 09/06/2014  . Finger mass, right 06/07/2013  . Seborrheic dermatitis of scalp 06/07/2013  . Routine general medical examination at a health care facility 03/14/2013  . Anxiety and depression 02/23/2013  . ALLERGIC RHINITIS 09/24/2009    Past Surgical History:  Procedure Laterality Date  . ESOPHAGOGASTRODUODENOSCOPY ENDOSCOPY  2014   told inflammatory bowel and not to eat gluten       Home Medications    Prior to Admission medications   Medication Sig Start Date End Date Taking? Authorizing Provider  Melatonin 10 MG TABS Take by mouth.   Yes [provider]  meclizine (ANTIVERT) 25 MG tablet Take 1 tablet (25 mg total) by mouth 3 (three) times daily as needed for dizziness. 10/21/16   Lurene Shadow, PA-C    Family History Family History  Problem Relation Age of Onset  . Adopted: Yes    Social History Social History  Substance Use Topics  . Smoking status: Former Games developer  . Smokeless tobacco: Never Used  . Alcohol use Yes     Comment: occasional glass of wine     Allergies   Prozac [fluoxetine hcl]; Venlafaxine; and Amoxicillin-pot clavulanate   Review of Systems Review of Systems  Constitutional: Positive for fatigue.  HENT: Negative for congestion, ear pain and sinus pain.   Respiratory: Negative for chest tightness and shortness of breath.   Cardiovascular: Positive for palpitations. Negative  for chest pain.  Gastrointestinal: Positive for nausea.  Musculoskeletal: Positive for neck pain and neck stiffness.  Neurological: Positive for dizziness and headaches.  Psychiatric/Behavioral: The patient is nervous/anxious.      Physical Exam Updated Vital Signs BP 126/80 (BP Location: Left Arm)   Pulse 77   Temp 98.3 F (36.8 C) (Oral)   Resp 14   Wt 117.5 kg   SpO2 99%   BMI 32.37 kg/m   Physical Exam  Constitutional: He is oriented to person, place, and time. He  appears well-developed and well-nourished.  Eyes: Pupils are equal, round, and reactive to light. Conjunctivae are normal. Right eye exhibits nystagmus. Left eye exhibits nystagmus.  Neck: Normal range of motion. Muscular tenderness present.  Cardiovascular: Normal rate, regular rhythm and normal heart sounds.   Pulmonary/Chest: Effort normal and breath sounds normal.  Neurological: He is alert and oriented to person, place, and time. He has normal strength. Coordination and gait normal.  Skin: Skin is warm and dry.  Psychiatric: His speech is normal. His mood appears anxious.     ED Treatments / Results  Labs (all labs ordered are listed, but only abnormal results are displayed) Labs Reviewed - No data to display  EKG  EKG Interpretation None       Radiology No results found.  Procedures Procedures (including critical care time)  Medications Ordered in ED Medications - No data to display   Initial Impression / Assessment and Plan / ED Course  I have reviewed the triage vital signs and the nursing notes.  Pertinent labs & imaging results that were available during my care of the patient were reviewed by me and considered in my medical decision making (see chart for details).     Mr. Allende experiences notable nystagmus in both eyes with lateral eye movement. His orthostatic blood pressures are WDL. His dizziness is brought on by level changes. Will start him on meclizine 25 mg prn up to 3 times daily. Have advised him to follow up with his PCP by the end of this week if he has not noticed any improvement in his symptoms.  Final Clinical Impressions(s) / ED Diagnoses   Final diagnoses:  Vertigo  Horizontal nystagmus    New Prescriptions Discharge Medication List as of 10/21/2016  1:44 PM    START taking these medications   Details  meclizine (ANTIVERT) 25 MG tablet Take 1 tablet (25 mg total) by mouth 3 (three) times daily as needed for dizziness., Starting Tue  10/21/2016, Print

## 2016-10-21 NOTE — ED Triage Notes (Signed)
Yesterday patient c/o onset of dizziness, head, shoulder and facial pressure, feeling flushed and waking during the night last night with increased heart rate and excessive fatigue. Eating does not relieve symptoms. No new medications.

## 2016-10-21 NOTE — Telephone Encounter (Signed)
Patient Name: Jimmy Hardin DOB: Aug 09, 1975 Initial Comment Caller states he is having dizziness and nausea. Nurse Assessment Nurse: Charna Elizabeth, RN, Cathy Date/Time (Eastern Time): 10/21/2016 12:14:56 PM Confirm and document reason for call. If symptomatic, describe symptoms. ---Jimmy Hardin states he developed dizziness and nausea yesterday. No injury in the past 3 days. No fever. Alert and responsive. Does the patient have any new or worsening symptoms? ---Yes Will a triage be completed? ---Yes Related visit to physician within the last 2 weeks? ---No Does the PT have any chronic conditions? (i.e. diabetes, asthma, etc.) ---Yes List chronic conditions. ---High Blood Pressure at times Is this a behavioral health or substance abuse call? ---No Guidelines Guideline Title Affirmed Question Affirmed Notes Dizziness - Lightheadedness [1] Dizziness caused by heat exposure, sudden standing, or poor fluid intake AND [2] no improvement after 2 hours of rest and fluids Final Disposition User See Physician within 4 Hours (or PCP triage) Charna Elizabeth, RN, Cathy Comments Unable to schedule an appointment at the office. Jimmy Hardin plans to go to urgent care facility. Referrals [facility] - UC Disagree/Comply: Comply

## 2016-11-27 ENCOUNTER — Emergency Department (HOSPITAL_BASED_OUTPATIENT_CLINIC_OR_DEPARTMENT_OTHER): Payer: BLUE CROSS/BLUE SHIELD

## 2016-11-27 ENCOUNTER — Emergency Department (HOSPITAL_BASED_OUTPATIENT_CLINIC_OR_DEPARTMENT_OTHER)
Admission: EM | Admit: 2016-11-27 | Discharge: 2016-11-27 | Disposition: A | Discharge status: Home / Self Care | Payer: BLUE CROSS/BLUE SHIELD | Attending: Emergency Medicine | Admitting: Emergency Medicine

## 2016-11-27 ENCOUNTER — Encounter (HOSPITAL_BASED_OUTPATIENT_CLINIC_OR_DEPARTMENT_OTHER): Payer: Self-pay

## 2016-11-27 DIAGNOSIS — Y999 Unspecified external cause status: Secondary | ICD-10-CM | POA: Diagnosis not present

## 2016-11-27 DIAGNOSIS — Y929 Unspecified place or not applicable: Secondary | ICD-10-CM | POA: Insufficient documentation

## 2016-11-27 DIAGNOSIS — W01198A Fall on same level from slipping, tripping and stumbling with subsequent striking against other object, initial encounter: Secondary | ICD-10-CM | POA: Diagnosis not present

## 2016-11-27 DIAGNOSIS — S62336A Displaced fracture of neck of fifth metacarpal bone, right hand, initial encounter for closed fracture: Secondary | ICD-10-CM | POA: Diagnosis not present

## 2016-11-27 DIAGNOSIS — Y9301 Activity, walking, marching and hiking: Secondary | ICD-10-CM | POA: Diagnosis not present

## 2016-11-27 DIAGNOSIS — S6991XA Unspecified injury of right wrist, hand and finger(s), initial encounter: Secondary | ICD-10-CM | POA: Diagnosis present

## 2016-11-27 NOTE — ED Triage Notes (Signed)
Pt states he tripped-struck right hand on "corner of the wall" approx 7pm-swelling noted-no break in skin-NAD-steady gait

## 2016-11-27 NOTE — ED Provider Notes (Signed)
Emergency Department Provider Note   I have reviewed the triage vital signs and the nursing notes.   HISTORY  Chief Complaint Hand Injury   HPI Jimmy Hardin is a 41 y.o. male presents to the ED for evaluation of right hand pain and swelling after striking his hand on a wall during a fall. He is right hand dominant and a chef. He states that he was walking when he tripped on a carpet that was sticking up and fell forward. He states that his closed fist struck the wall while falling. He initially tried home pain medication but this was not effective. He denies punching the wall or a face/mouth. No head trauma or LOC with the fall. Denies any numbness/tingling in the hand/fingers.    Past Medical History:  Diagnosis Date  . Seasonal allergies     Patient Active Problem List   Diagnosis Date Noted  . Erectile dysfunction 09/06/2014  . GERD (gastroesophageal reflux disease) 09/06/2014  . Finger mass, right 06/07/2013  . Seborrheic dermatitis of scalp 06/07/2013  . Routine general medical examination at a health care facility 03/14/2013  . Anxiety and depression 02/23/2013  . ALLERGIC RHINITIS 09/24/2009    Past Surgical History:  Procedure Laterality Date  . ESOPHAGOGASTRODUODENOSCOPY ENDOSCOPY  2014   told inflammatory bowel and not to eat gluten    Current Outpatient Rx  . Order #: [id number] Class: Historical Med    Allergies Prozac [fluoxetine hcl]; Venlafaxine; and Amoxicillin-pot clavulanate  Family History  Problem Relation Age of Onset  . Adopted: Yes    Social History Social History  Substance Use Topics  . Smoking status: Former Games developermoker  . Smokeless tobacco: Never Used  . Alcohol use Yes     Comment: daily    Review of Systems  Constitutional: No fever/chills Eyes: No visual changes. ENT: No sore throat. Cardiovascular: Denies chest pain. Respiratory: Denies shortness of breath. Gastrointestinal: No abdominal pain.  No nausea, no vomiting.   No diarrhea.  No constipation. Genitourinary: Negative for dysuria. Musculoskeletal: Negative for back pain. Positive right hand pain.  Skin: Negative for rash. Neurological: Negative for headaches, focal weakness or numbness.  10-point ROS otherwise negative.  ____________________________________________   PHYSICAL EXAM:  VITAL SIGNS: ED Triage Vitals  Enc Vitals Group     BP 11/27/16 2206 (!) 130/91     Pulse Rate 11/27/16 2206 66     Resp 11/27/16 2206 20     Temp 11/27/16 2206 98.3 F (36.8 C)     Temp Source 11/27/16 2206 Oral     SpO2 11/27/16 2206 100 %     Weight 11/27/16 2205 257 lb 15 oz (117 kg)     Height 11/27/16 2205 6\' 4"  (1.93 m)     Pain Score 11/27/16 2203 8   Constitutional: Alert and oriented. Well appearing and in no acute distress. Eyes: Conjunctivae are normal.  Head: Atraumatic. Nose: No congestion/rhinnorhea. Mouth/Throat: Mucous membranes are moist.   Neck: No stridor.  Cardiovascular: Good peripheral circulation.  Respiratory: Normal respiratory effort.   Gastrointestinal: No distention.  Musculoskeletal: Right hand swelling over the 5th metacarpal. No lacerations or abrasions noted. No erythema or warmth.  Neurologic:  Normal speech and language. No gross focal neurologic deficits are appreciated.  Skin:  Skin is warm, dry and intact. No rash noted.  ____________________________________________  RADIOLOGY  Dg Hand Complete Right  Result Date: 11/27/2016 CLINICAL DATA:  Right hand pain in the region of the fifth metacarpal head following a fall  today. EXAM: RIGHT HAND - COMPLETE 3+ VIEW COMPARISON:  None. FINDINGS: Comminuted fracture of the distal portion of the fifth metacarpal, including the shaft, neck and head. This extends into the fifth MCP joint. There is ventral angulation and mild ventral displacement of the distal fragment. IMPRESSION: Comminuted distal fifth metacarpal fracture with intra-articular involvement, as described above.  Electronically Signed   By: Beckie Salts M.D.   On: 11/27/2016 22:50    ____________________________________________   PROCEDURES  Procedure(s) performed:   Procedures  None ____________________________________________   INITIAL IMPRESSION / ASSESSMENT AND PLAN / ED COURSE  Pertinent labs & imaging results that were available during my care of the patient were reviewed by me and considered in my medical decision making (see chart for details).  Patient presents to the ED with a right hand injury. He has swelling over the 5th metacarpal with intra-articular neck fracture consistent with injury mechanism. No wrist tenderness. No evidence of fight bite. Discussed case hand surgeon on call, Dr. Jena Gauss. Who advises splint and f/u with him next week. Splint applied. Contact info given for hand surgery at discharge. Patient does not want stronger pain medication than Tylenol/Motrin.   At this time, I do not feel there is any life-threatening condition present. I have reviewed and discussed all results (EKG, imaging, lab, urine as appropriate), exam findings with patient. I have reviewed nursing notes and appropriate previous records.  I feel the patient is safe to be discharged home without further emergent workup. Discussed usual and customary return precautions. Patient and family (if present) verbalize understanding and are comfortable with this plan.  Patient will follow-up with their primary care provider. If they do not have a primary care provider, information for follow-up has been provided to them. All questions have been answered.  ____________________________________________  FINAL CLINICAL IMPRESSION(S) / ED DIAGNOSES  Final diagnoses:  Closed displaced fracture of neck of fifth metacarpal bone of right hand, initial encounter     MEDICATIONS GIVEN DURING THIS VISIT:  Medications - No data to display   NEW OUTPATIENT MEDICATIONS STARTED DURING THIS VISIT:  None  Note:   This document was prepared using Dragon voice recognition software and may include unintentional dictation errors.  Alona Bene, MD Emergency Medicine    Khaleem Burchill, Arlyss Repress, MD 11/29/16 [phone]

## 2016-11-27 NOTE — ED Notes (Signed)
Patient transported to X-ray 

## 2016-11-27 NOTE — Discharge Instructions (Signed)
You were seen in the ED today with a hand fracture. We have placed you in a splint that will need to be kept clean and dry. Call the hand surgeon on the phone tomorrow morning, Dr. Jena GaussHaddix, in the morning to schedule an appointment for the next week.    Take Tylenol and/or Motrin as needed for pain. Keep the hand elevated when seated.

## 2017-02-05 ENCOUNTER — Emergency Department (HOSPITAL_BASED_OUTPATIENT_CLINIC_OR_DEPARTMENT_OTHER)
Admission: EM | Admit: 2017-02-05 | Discharge: 2017-02-06 | Disposition: A | Discharge status: Home / Self Care | Payer: BLUE CROSS/BLUE SHIELD | Attending: Emergency Medicine | Admitting: Emergency Medicine

## 2017-02-05 ENCOUNTER — Other Ambulatory Visit: Payer: Self-pay

## 2017-02-05 ENCOUNTER — Emergency Department (HOSPITAL_BASED_OUTPATIENT_CLINIC_OR_DEPARTMENT_OTHER): Payer: BLUE CROSS/BLUE SHIELD

## 2017-02-05 ENCOUNTER — Encounter (HOSPITAL_BASED_OUTPATIENT_CLINIC_OR_DEPARTMENT_OTHER): Payer: Self-pay | Admitting: Emergency Medicine

## 2017-02-05 DIAGNOSIS — K5900 Constipation, unspecified: Secondary | ICD-10-CM | POA: Diagnosis not present

## 2017-02-05 DIAGNOSIS — Z87891 Personal history of nicotine dependence: Secondary | ICD-10-CM | POA: Diagnosis not present

## 2017-02-05 DIAGNOSIS — Z79899 Other long term (current) drug therapy: Secondary | ICD-10-CM | POA: Insufficient documentation

## 2017-02-05 DIAGNOSIS — R1031 Right lower quadrant pain: Secondary | ICD-10-CM | POA: Diagnosis present

## 2017-02-05 LAB — CBC WITH DIFFERENTIAL/PLATELET
Basophils Absolute: 0 10*3/uL (ref 0.0–0.1)
Basophils Relative: 0 %
EOS ABS: 0.2 10*3/uL (ref 0.0–0.7)
Eosinophils Relative: 2 %
HCT: 40.7 % (ref 39.0–52.0)
HEMOGLOBIN: 14.3 g/dL (ref 13.0–17.0)
Lymphocytes Relative: 21 %
Lymphs Abs: 1.9 10*3/uL (ref 0.7–4.0)
MCH: 30.8 pg (ref 26.0–34.0)
MCHC: 35.1 g/dL (ref 30.0–36.0)
MCV: 87.5 fL (ref 78.0–100.0)
Monocytes Absolute: 0.5 10*3/uL (ref 0.1–1.0)
Monocytes Relative: 6 %
NEUTROS PCT: 71 %
Neutro Abs: 6.4 10*3/uL (ref 1.7–7.7)
Platelets: 234 10*3/uL (ref 150–400)
RBC: 4.65 MIL/uL (ref 4.22–5.81)
RDW: 12.7 % (ref 11.5–15.5)
WBC: 9.1 10*3/uL (ref 4.0–10.5)

## 2017-02-05 LAB — COMPREHENSIVE METABOLIC PANEL
ALT: 22 U/L (ref 17–63)
ANION GAP: 8 (ref 5–15)
AST: 21 U/L (ref 15–41)
Albumin: 4.1 g/dL (ref 3.5–5.0)
Alkaline Phosphatase: 54 U/L (ref 38–126)
BUN: 18 mg/dL (ref 6–20)
CHLORIDE: 104 mmol/L (ref 101–111)
CO2: 24 mmol/L (ref 22–32)
CREATININE: 0.85 mg/dL (ref 0.61–1.24)
Calcium: 8.5 mg/dL — ABNORMAL LOW (ref 8.9–10.3)
GFR calc non Af Amer: >60 mL/min (ref >60)
Glucose, Bld: 130 mg/dL — ABNORMAL HIGH (ref 65–99)
POTASSIUM: 3.6 mmol/L (ref 3.5–5.1)
Sodium: 136 mmol/L (ref 135–145)
Total Bilirubin: 0.5 mg/dL (ref 0.3–1.2)
Total Protein: 6.8 g/dL (ref 6.5–8.1)

## 2017-02-05 LAB — URINALYSIS, ROUTINE W REFLEX MICROSCOPIC
BILIRUBIN URINE: NEGATIVE
Glucose, UA: NEGATIVE mg/dL
HGB URINE DIPSTICK: NEGATIVE
KETONES UR: NEGATIVE mg/dL
Leukocytes, UA: NEGATIVE
Nitrite: NEGATIVE
PROTEIN: NEGATIVE mg/dL
Specific Gravity, Urine: 1.015 (ref 1.005–1.030)
pH: 7 (ref 5.0–8.0)

## 2017-02-05 NOTE — ED Triage Notes (Signed)
RLQ pain x2-3 days getting progressively worse. Tender to touch.  Worse with movement.

## 2017-02-06 ENCOUNTER — Emergency Department (HOSPITAL_BASED_OUTPATIENT_CLINIC_OR_DEPARTMENT_OTHER): Payer: BLUE CROSS/BLUE SHIELD

## 2017-02-06 MED ORDER — IOPAMIDOL (ISOVUE-300) INJECTION 61%
100.0000 mL | Freq: Once | INTRAVENOUS | Status: AC | PRN
  Administered 2017-02-06: 100 mL via INTRAVENOUS

## 2017-02-06 MED ORDER — MAGNESIUM CITRATE PO SOLN
1.0000 | Freq: Once | ORAL | Status: AC
Start: 2017-02-06 — End: 2017-02-06
  Administered 2017-02-06: 1 via ORAL
  Filled 2017-02-06: qty 296

## 2017-02-06 NOTE — ED Notes (Signed)
Pt verbalizes understanding of d/c instructions and denies any further needs at this time. 

## 2017-02-06 NOTE — ED Provider Notes (Signed)
[facility] Provider Note: Lowella Dell, MD, FACEP  CSN: [id number] MRN: [id number] ARRIVAL: 02/05/17 at 2242 ROOM: [facility]   CHIEF COMPLAINT  Abdominal Pain   HISTORY OF PRESENT ILLNESS  02/06/17 12:44 AM Jimmy Hardin is a 42 y.o. male with a 2-day history of right lower quadrant pain.  The pain is well localized and has both dull and sharp components.  It is worse with movement and certain positions.  It is moderate in severity but worse at times.  It is been associated with some nausea but no vomiting.  He has also had constipation for the past 2 days.  His appetite has been decreased but he denies fever or chills.  He has not taken any laxatives out of concern for this being appendicitis.    Past Medical History:  Diagnosis Date  . Seasonal allergies     Past Surgical History:  Procedure Laterality Date  . ESOPHAGOGASTRODUODENOSCOPY ENDOSCOPY  2014   told inflammatory bowel and not to eat gluten    Family History  Adopted: Yes    Social History   Tobacco Use  . Smoking status: Former Games developer  . Smokeless tobacco: Never Used  Substance Use Topics  . Alcohol use: Yes    Alcohol/week: 1.8 - 3.0 oz    Types: 2 - 3 Cans of beer, 1 - 2 Glasses of wine per week    Comment: daily  . Drug use: No    Prior to Admission medications   Medication Sig Start Date End Date Taking? Authorizing Provider  Melatonin 10 MG TABS Take by mouth.    [provider]    Allergies Prozac [fluoxetine hcl]; Venlafaxine; and Amoxicillin-pot clavulanate   REVIEW OF SYSTEMS  Negative except as noted here or in the History of Present Illness.   PHYSICAL EXAMINATION  Initial Vital Signs Blood pressure 127/90, pulse 62, temperature 98.8 F (37.1 C), temperature source Oral, resp. rate 16, height 6\' 4"  (1.93 m), weight 120.2 kg (265 lb), SpO2 98 %.  Examination General: Well-developed, well-nourished male in no acute distress; appearance consistent with age  of record HENT: normocephalic; atraumatic Eyes: pupils equal, round and reactive to light; extraocular muscles intact Neck: supple Heart: regular rate and rhythm Lungs: clear to auscultation bilaterally Abdomen: soft; nondistended; right lower quadrant tenderness; bowel sounds present Extremities: No deformity; full range of motion; pulses normal Neurologic: Awake, alert and oriented; motor function intact in all extremities and symmetric; no facial droop Skin: Warm and dry Psychiatric: Normal mood and affect   RESULTS  Summary of this visit's results, reviewed by myself:   EKG Interpretation  Date/Time:    Ventricular Rate:    PR Interval:    QRS Duration:   QT Interval:    QTC Calculation:   R Axis:     Text Interpretation:        Laboratory Studies: Results for orders placed or performed during the hospital encounter of 02/05/17 (from the past 24 hour(s))  Urinalysis, Routine w reflex microscopic     Status: None   Collection Time: 02/05/17 10:51 PM  Result Value Ref Range   Color, Urine YELLOW YELLOW   APPearance CLEAR CLEAR   Specific Gravity, Urine 1.015 1.005 - 1.030   pH 7.0 5.0 - 8.0   Glucose, UA NEGATIVE NEGATIVE mg/dL   Hgb urine dipstick NEGATIVE NEGATIVE   Bilirubin Urine NEGATIVE NEGATIVE   Ketones, ur NEGATIVE NEGATIVE mg/dL   Protein, ur NEGATIVE NEGATIVE mg/dL   Nitrite  NEGATIVE NEGATIVE   Leukocytes, UA NEGATIVE NEGATIVE  Comprehensive metabolic panel     Status: Abnormal   Collection Time: 02/05/17 11:14 PM  Result Value Ref Range   Sodium 136 135 - 145 mmol/L   Potassium 3.6 3.5 - 5.1 mmol/L   Chloride 104 101 - 111 mmol/L   CO2 24 22 - 32 mmol/L   Glucose, Bld 130 (H) 65 - 99 mg/dL   BUN 18 6 - 20 mg/dL   Creatinine, Ser 4.090.85 0.61 - 1.24 mg/dL   Calcium 8.5 (L) 8.9 - 10.3 mg/dL   Total Protein 6.8 6.5 - 8.1 g/dL   Albumin 4.1 3.5 - 5.0 g/dL   AST 21 15 - 41 U/L   ALT 22 17 - 63 U/L   Alkaline Phosphatase 54 38 - 126 U/L   Total  Bilirubin 0.5 0.3 - 1.2 mg/dL   GFR calc non Af Amer >60 >60 mL/min   GFR calc Af Amer >60 >60 mL/min   Anion gap 8 5 - 15  CBC with Differential     Status: None   Collection Time: 02/05/17 11:14 PM  Result Value Ref Range   WBC 9.1 4.0 - 10.5 K/uL   RBC 4.65 4.22 - 5.81 MIL/uL   Hemoglobin 14.3 13.0 - 17.0 g/dL   HCT 81.140.7 91.439.0 - 78.252.0 %   MCV 87.5 78.0 - 100.0 fL   MCH 30.8 26.0 - 34.0 pg   MCHC 35.1 30.0 - 36.0 g/dL   RDW 95.612.7 21.311.5 - 08.615.5 %   Platelets 234 150 - 400 K/uL   Neutrophils Relative % 71 %   Neutro Abs 6.4 1.7 - 7.7 K/uL   Lymphocytes Relative 21 %   Lymphs Abs 1.9 0.7 - 4.0 K/uL   Monocytes Relative 6 %   Monocytes Absolute 0.5 0.1 - 1.0 K/uL   Eosinophils Relative 2 %   Eosinophils Absolute 0.2 0.0 - 0.7 K/uL   Basophils Relative 0 %   Basophils Absolute 0.0 0.0 - 0.1 K/uL   Imaging Studies: Ct Abdomen Pelvis W Contrast  Result Date: 02/06/2017 CLINICAL DATA:  Right lower quadrant pain for 2 days. White cell count 9.1. EXAM: CT ABDOMEN AND PELVIS WITH CONTRAST TECHNIQUE: Multidetector CT imaging of the abdomen and pelvis was performed using the standard protocol following bolus administration of intravenous contrast. CONTRAST:  100mL ISOVUE-300 IOPAMIDOL (ISOVUE-300) INJECTION 61% COMPARISON:  11/27/2015 FINDINGS: Lower chest: 4 mm nodule in the left lung base is unchanged since prior study. Hepatobiliary: No focal liver abnormality is seen. No gallstones, gallbladder wall thickening, or biliary dilatation. Pancreas: Unremarkable. No pancreatic ductal dilatation or surrounding inflammatory changes. Spleen: Normal in size without focal abnormality. Adrenals/Urinary Tract: Adrenal glands are unremarkable. Kidneys are normal, without renal calculi, focal lesion, or hydronephrosis. Bladder is unremarkable. Stomach/Bowel: Stomach is within normal limits. Appendix appears normal. No evidence of bowel wall thickening, distention, or inflammatory changes. Vascular/Lymphatic: No  significant vascular findings are present. No enlarged abdominal or pelvic lymph nodes. Reproductive: Prostate is unremarkable. Other: No abdominal wall hernia or abnormality. No abdominopelvic ascites. Musculoskeletal: No acute or significant osseous findings. IMPRESSION: 1. Normal appendix. No evidence of bowel obstruction or inflammation. 2. 4 mm nodule in the left lung base is unchanged since previous study. This is likely benign. 3. No evidence of renal or ureteral stone or obstruction. Electronically Signed   By: Burman NievesWilliam  Stevens M.D.   On: 02/06/2017 01:48   Dg Abdomen Acute W/chest  Result Date: 02/06/2017 CLINICAL DATA:  Increasing right lower quadrant pain today. Increased pain with movement. EXAM: DG ABDOMEN ACUTE W/ 1V CHEST COMPARISON:  CT abdomen and pelvis 11/27/2015 FINDINGS: Heart size and pulmonary vascularity are normal. Scarring in the left lung. No airspace disease or consolidation. No blunting of costophrenic angles. No pneumothorax. Mediastinal contours appear intact. Scattered gas and stool throughout the colon. No small or large bowel distention. No free intra-abdominal air. No radiopaque stones. Soft tissue contours appear intact. Visualized bones appear intact. IMPRESSION: No evidence of active pulmonary disease. Nonobstructive bowel gas pattern. Scattered stool throughout the colon. Electronically Signed   By: Burman Nieves M.D.   On: 02/06/2017 00:30    ED COURSE  Nursing notes and initial vitals signs, including pulse oximetry, reviewed.  Vitals:   02/05/17 2249 02/05/17 2250 02/06/17 0126  BP: 127/90  122/81  Pulse: 62  72  Resp: 16  16  Temp: 98.8 F (37.1 C)    TempSrc: Oral    SpO2: 98%  100%  Weight:  120.2 kg (265 lb)   Height:  6\' 4"  (1.93 m)     PROCEDURES    ED DIAGNOSES     ICD-10-CM   1. Right lower quadrant abdominal pain R10.31   2. Acute constipation K59.00        Serena Petterson, Jonny Ruiz, MD 02/06/17 [phone]

## 2018-01-26 IMAGING — DX DG HAND COMPLETE 3+V*R*
3 series · 3 of 3 positions shown · non-contrast
Comparison: None.

CLINICAL DATA: Right hand pain in the region of the fifth
metacarpal head following a fall today.

EXAM:
RIGHT HAND - COMPLETE 3+ VIEW

[hand pa]
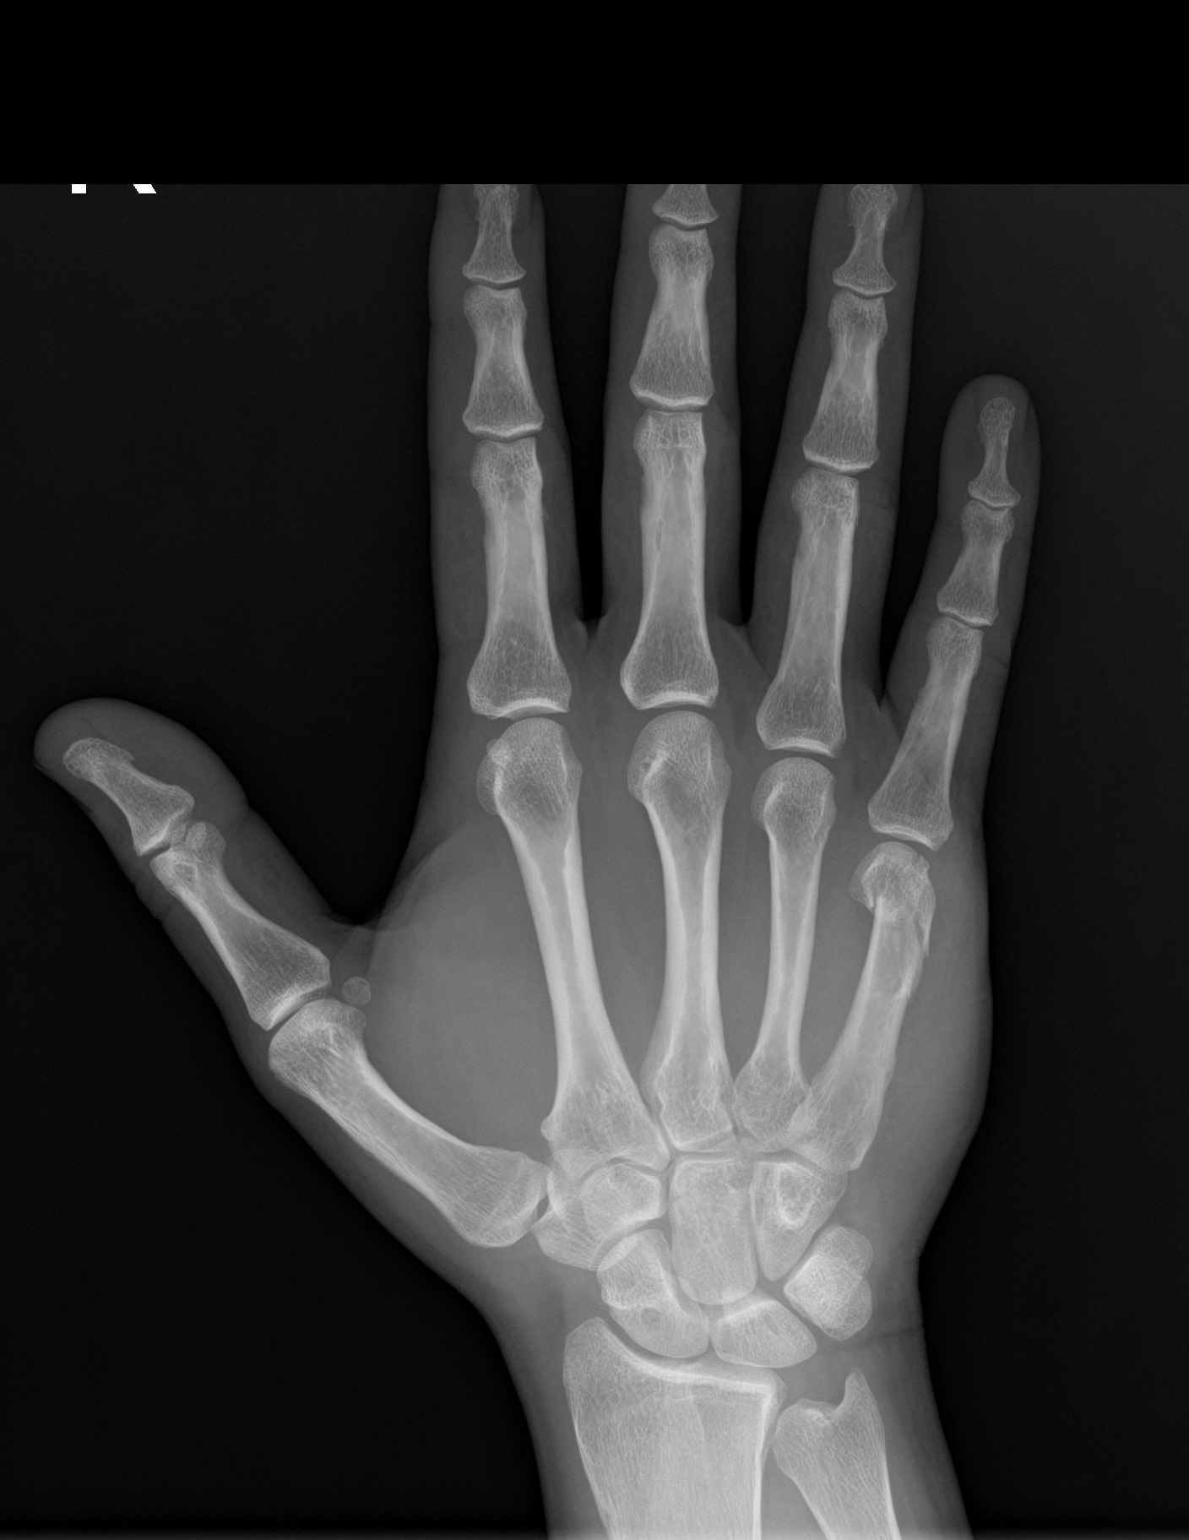

[hand obl]
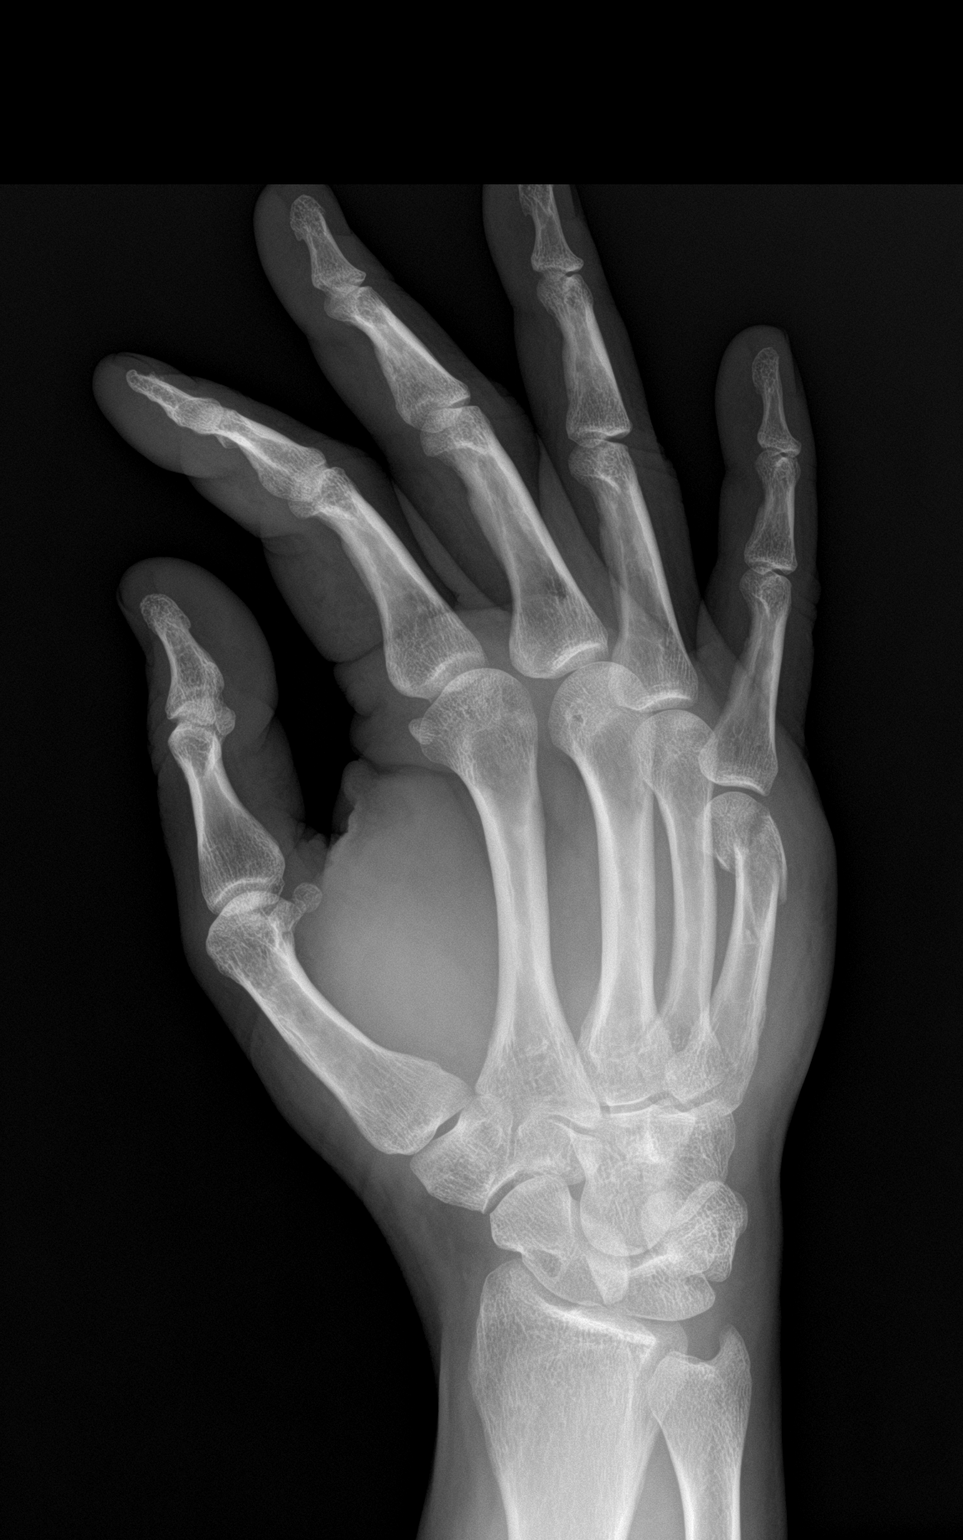

[hand lat]
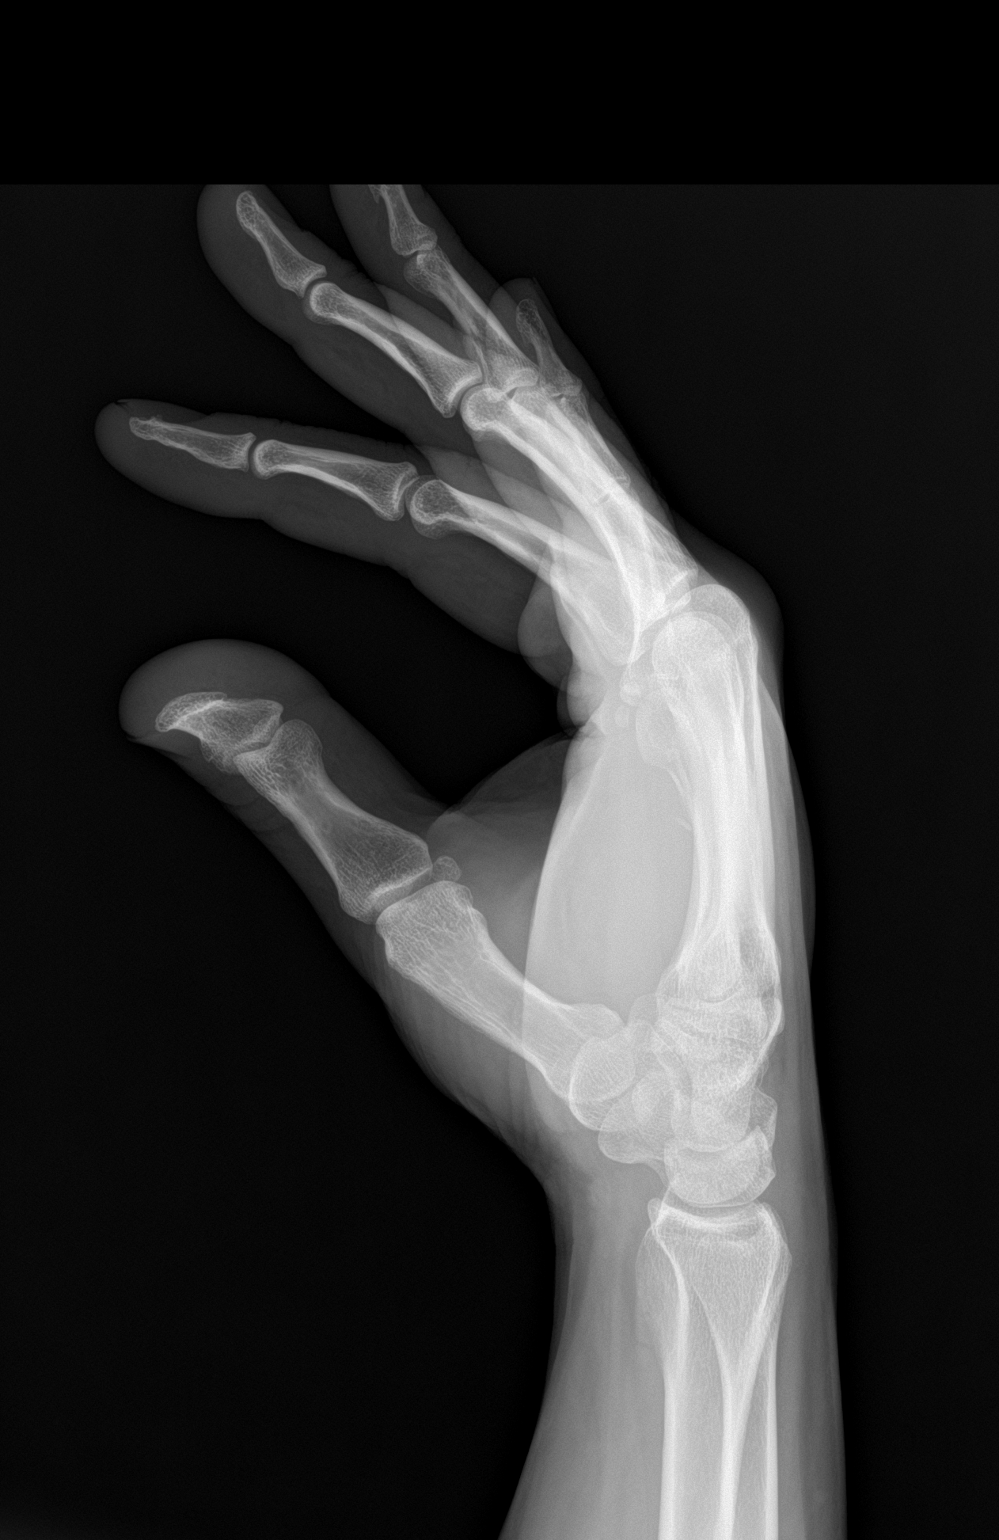

[3 of 3 positions shown; findings below may reference images not displayed]

FINDINGS: Comminuted fracture of the distal portion of the fifth metacarpal,
including the shaft, neck and head. This extends into the fifth MCP
joint. There is ventral angulation and mild ventral displacement of
the distal fragment.
IMPRESSION: Comminuted distal fifth metacarpal fracture with intra-articular
involvement, as described above.

## 2018-04-07 IMAGING — CT CT ABD-PELV W/ CM
2 of 5 series · 16 of 46 positions shown, 18 images · IV contrast (APPLIED)
Comparison: 11/27/2015

CLINICAL DATA: Right lower quadrant pain for 2 days. White cell
count 9.1.

EXAM:
CT ABDOMEN AND PELVIS WITH CONTRAST
TECHNIQUE: Multidetector CT imaging of the abdomen and pelvis was performed
using the standard protocol following bolus administration of
intravenous contrast.
CONTRAST:  100mL WJU9YK-IEE IOPAMIDOL (WJU9YK-IEE) INJECTION 61%

[Series 2: axial st · axial · 0.98mm/px · z∈[-604,-84]mm · 13 of 118 slices shown, 15 images]
[im 7/118  soft-tissue]
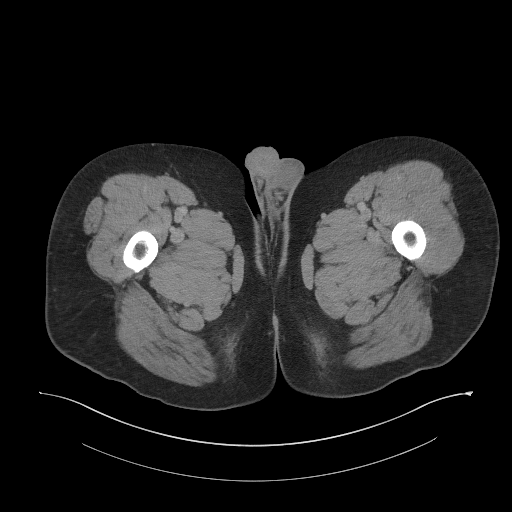
[im 7/118  bone]
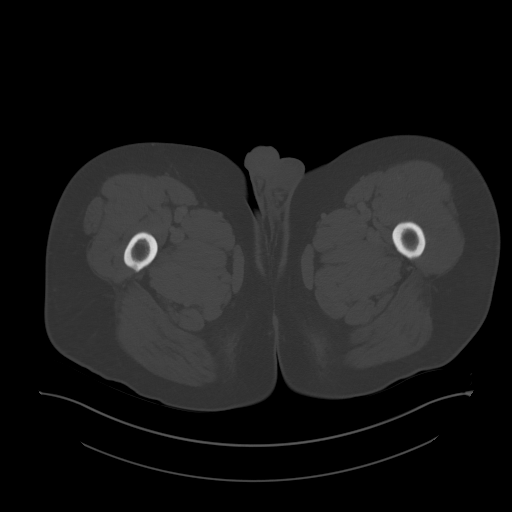
[im 19/118  soft-tissue]
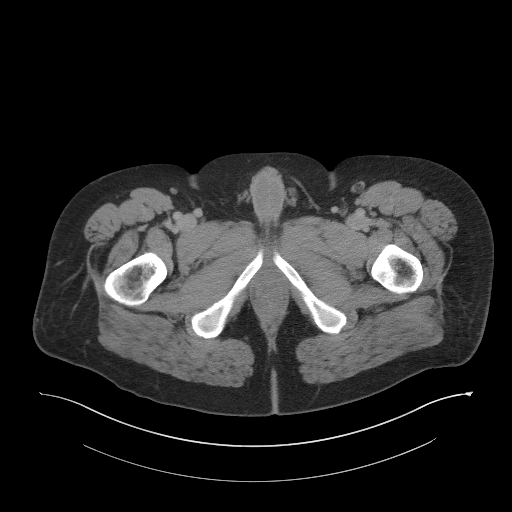
[im 25/118  soft-tissue]
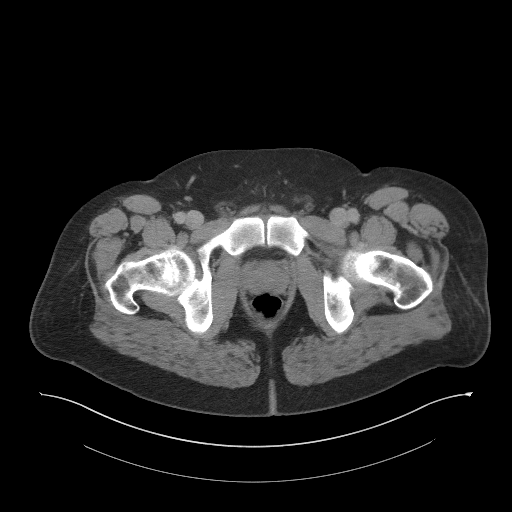
[im 31/118  soft-tissue]
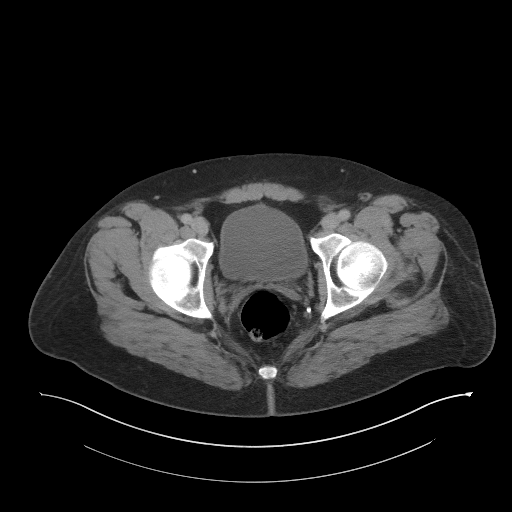
[im 44/118  soft-tissue]
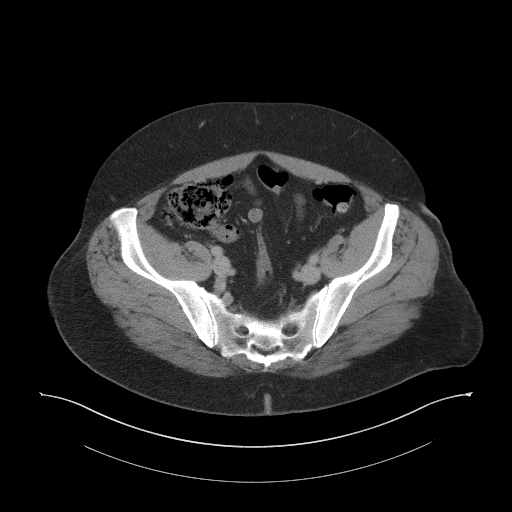
[im 50/118  soft-tissue]
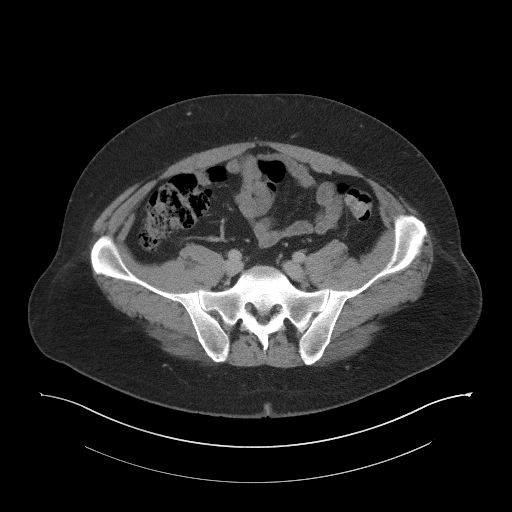
[im 62/118  soft-tissue]
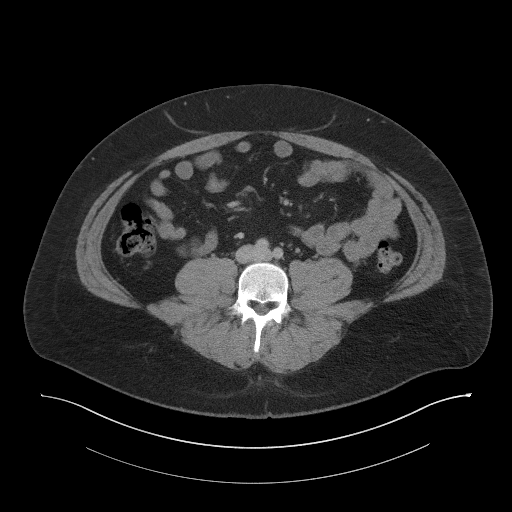
[im 68/118  soft-tissue]
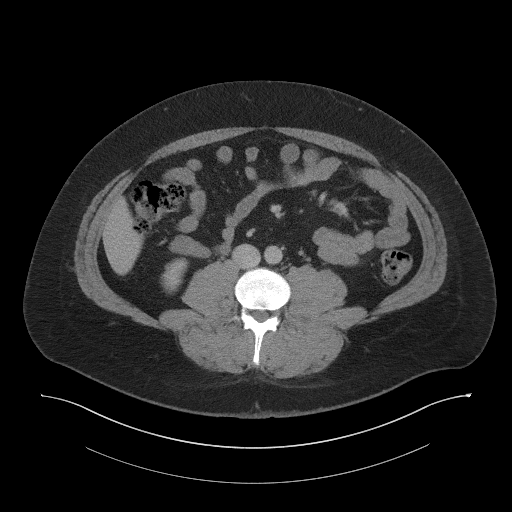
[im 74/118  soft-tissue]
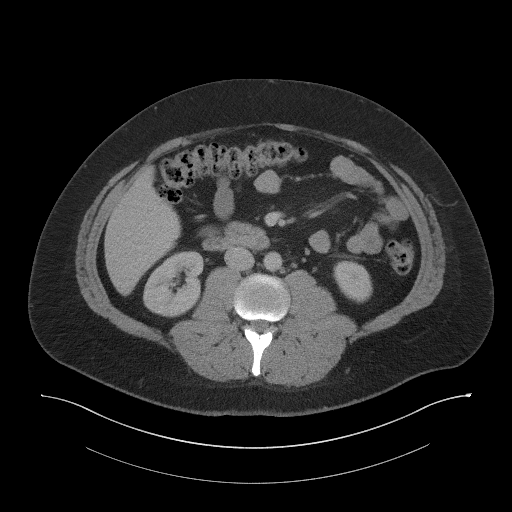
[im 74/118  bone]
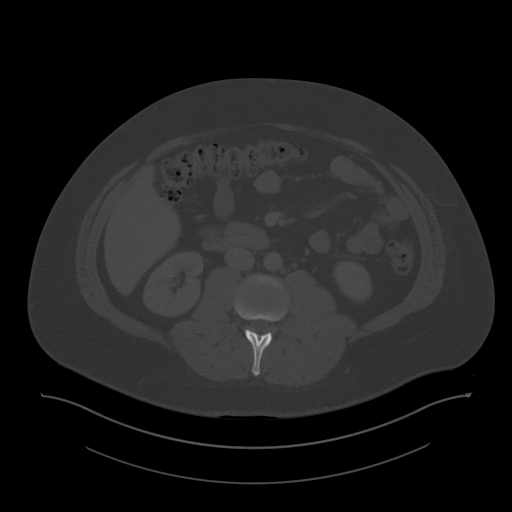
[im 87/118  soft-tissue]
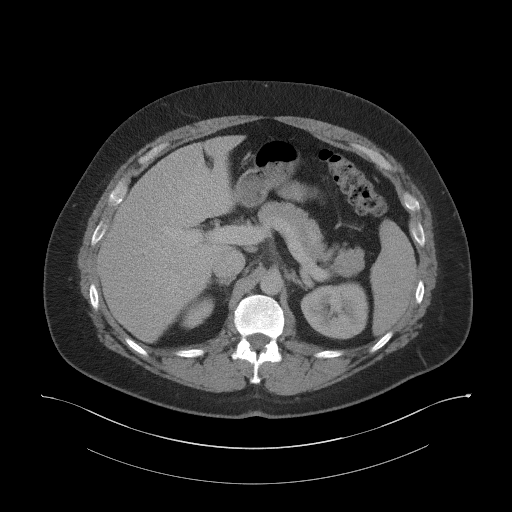
[im 93/118  soft-tissue]
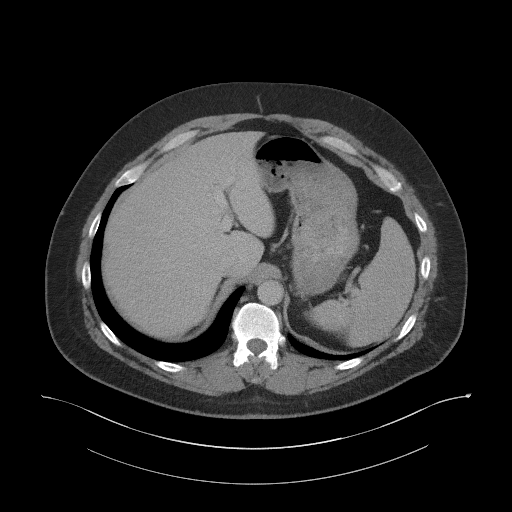
[im 99/118  soft-tissue]
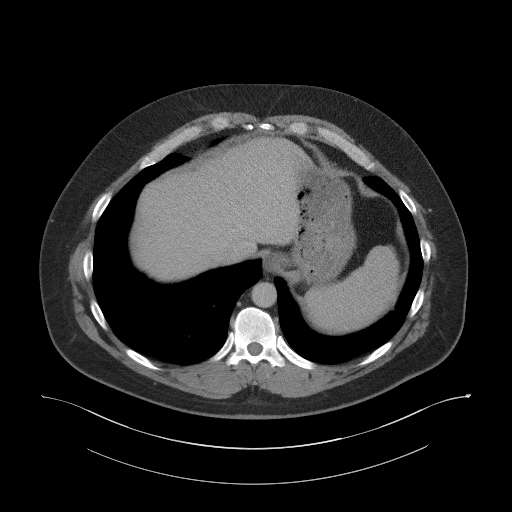
[im 111/118  soft-tissue]
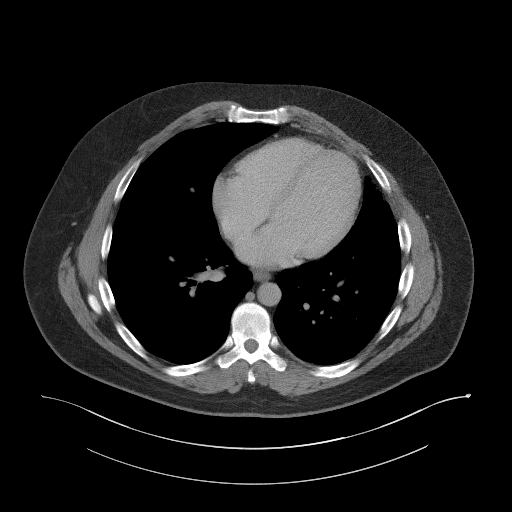

[Series 5: coronal st · coronal · 0.95mm/px · 3 of 94 slices shown]
[im 32/94  soft-tissue]
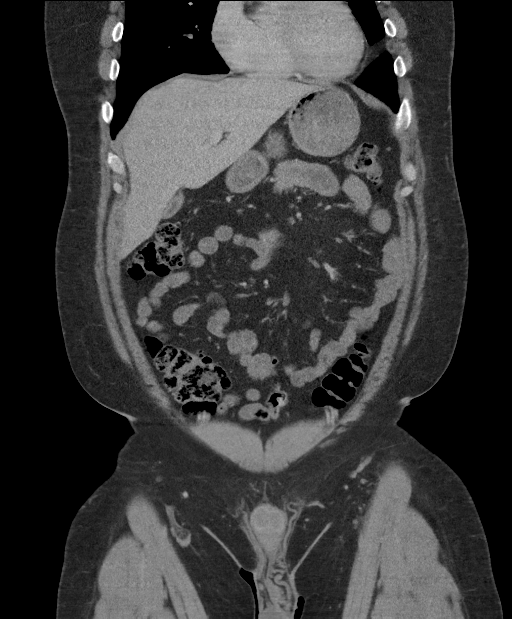
[im 42/94  soft-tissue]
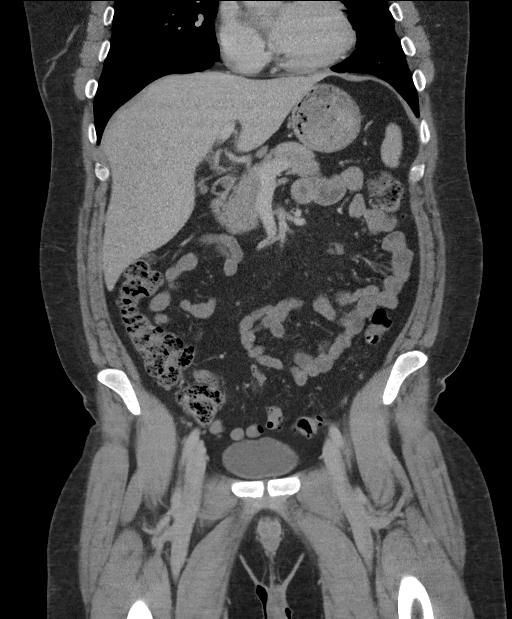
[im 52/94  soft-tissue]
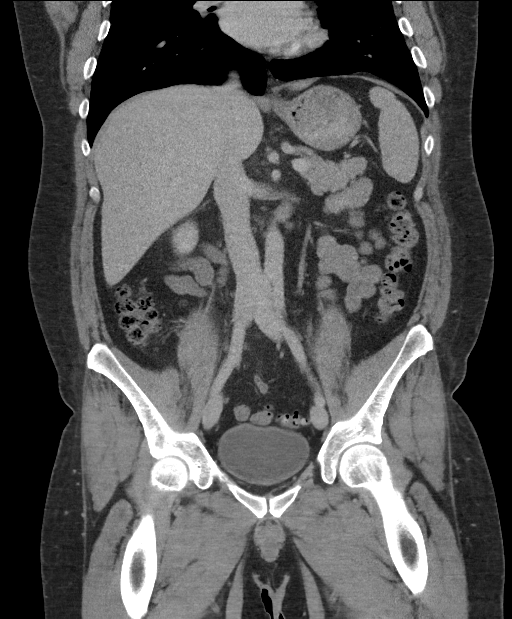

[16 of 46 positions shown; findings below may reference images not displayed]

FINDINGS: Lower chest: 4 mm nodule in the left lung base is unchanged since
prior study.

Hepatobiliary: No focal liver abnormality is seen. No gallstones,
gallbladder wall thickening, or biliary dilatation.

Pancreas: Unremarkable. No pancreatic ductal dilatation or
surrounding inflammatory changes.

Spleen: Normal in size without focal abnormality.

Adrenals/Urinary Tract: Adrenal glands are unremarkable. Kidneys are
normal, without renal calculi, focal lesion, or hydronephrosis.
Bladder is unremarkable.

Stomach/Bowel: Stomach is within normal limits. Appendix appears
normal. No evidence of bowel wall thickening, distention, or
inflammatory changes.

Vascular/Lymphatic: No significant vascular findings are present. No
enlarged abdominal or pelvic lymph nodes.

Reproductive: Prostate is unremarkable.

Other: No abdominal wall hernia or abnormality. No abdominopelvic
ascites.

Musculoskeletal: No acute or significant osseous findings.
IMPRESSION: 1. Normal appendix. No evidence of bowel obstruction or
inflammation.
2. 4 mm nodule in the left lung base is unchanged since previous
study. This is likely benign.
3. No evidence of renal or ureteral stone or obstruction.
